# Patient Record
Sex: Female | Born: 1935 | Race: White | Hispanic: No | Marital: Married | Smoking: Never smoker
Health system: Southern US, Community
[De-identification: ages and names within clinical notes are randomized; demographics above are authoritative.]

## PROBLEM LIST (undated history)

## (undated) DIAGNOSIS — E785 Hyperlipidemia, unspecified: Secondary | ICD-10-CM

## (undated) DIAGNOSIS — I350 Nonrheumatic aortic (valve) stenosis: Secondary | ICD-10-CM

## (undated) DIAGNOSIS — I10 Essential (primary) hypertension: Secondary | ICD-10-CM

## (undated) DIAGNOSIS — I2510 Atherosclerotic heart disease of native coronary artery without angina pectoris: Secondary | ICD-10-CM

## (undated) HISTORY — DX: Hyperlipidemia, unspecified: E78.5

## (undated) HISTORY — DX: Atherosclerotic heart disease of native coronary artery without angina pectoris: I25.10

## (undated) HISTORY — DX: Nonrheumatic aortic (valve) stenosis: I35.0

## (undated) HISTORY — DX: Essential (primary) hypertension: I10

---

## 1998-08-17 ENCOUNTER — Other Ambulatory Visit: Admission: RE | Admit: 1998-08-17 | Discharge: 1998-08-17 | Payer: Self-pay | Admitting: Internal Medicine

## 1999-03-30 ENCOUNTER — Encounter: Admission: RE | Admit: 1999-03-30 | Discharge: 1999-03-30 | Payer: Self-pay | Admitting: Internal Medicine

## 1999-03-30 ENCOUNTER — Encounter: Payer: Self-pay | Admitting: Internal Medicine

## 2000-02-06 ENCOUNTER — Encounter: Payer: Self-pay | Admitting: General Surgery

## 2000-02-06 ENCOUNTER — Encounter: Admission: RE | Admit: 2000-02-06 | Discharge: 2000-02-06 | Payer: Self-pay | Admitting: General Surgery

## 2001-02-25 ENCOUNTER — Encounter: Admission: RE | Admit: 2001-02-25 | Discharge: 2001-02-25 | Payer: Self-pay | Admitting: Internal Medicine

## 2001-02-25 ENCOUNTER — Encounter: Payer: Self-pay | Admitting: Internal Medicine

## 2003-01-21 ENCOUNTER — Encounter: Payer: Self-pay | Admitting: Internal Medicine

## 2003-01-21 ENCOUNTER — Encounter: Admission: RE | Admit: 2003-01-21 | Discharge: 2003-01-21 | Payer: Self-pay | Admitting: Internal Medicine

## 2004-01-24 ENCOUNTER — Encounter: Admission: RE | Admit: 2004-01-24 | Discharge: 2004-01-24 | Payer: Self-pay | Admitting: Internal Medicine

## 2005-02-21 ENCOUNTER — Encounter: Admission: RE | Admit: 2005-02-21 | Discharge: 2005-02-21 | Payer: Self-pay | Admitting: Internal Medicine

## 2006-03-04 ENCOUNTER — Encounter: Admission: RE | Admit: 2006-03-04 | Discharge: 2006-03-04 | Payer: Self-pay | Admitting: Internal Medicine

## 2006-12-10 ENCOUNTER — Other Ambulatory Visit: Admission: RE | Admit: 2006-12-10 | Discharge: 2006-12-10 | Payer: Self-pay | Admitting: Internal Medicine

## 2007-03-10 ENCOUNTER — Encounter: Admission: RE | Admit: 2007-03-10 | Discharge: 2007-03-10 | Payer: Self-pay | Admitting: Internal Medicine

## 2008-03-10 ENCOUNTER — Encounter: Admission: RE | Admit: 2008-03-10 | Discharge: 2008-03-10 | Payer: Self-pay | Admitting: Internal Medicine

## 2009-03-15 ENCOUNTER — Encounter: Admission: RE | Admit: 2009-03-15 | Discharge: 2009-03-15 | Payer: Self-pay | Admitting: Internal Medicine

## 2010-03-20 ENCOUNTER — Encounter
Admission: RE | Admit: 2010-03-20 | Discharge: 2010-03-20 | Payer: Self-pay | Source: Home / Self Care | Attending: Internal Medicine | Admitting: Internal Medicine

## 2010-05-02 HISTORY — PX: OTHER SURGICAL HISTORY: SHX169

## 2011-02-28 ENCOUNTER — Other Ambulatory Visit: Payer: Self-pay | Admitting: Internal Medicine

## 2011-02-28 DIAGNOSIS — Z1231 Encounter for screening mammogram for malignant neoplasm of breast: Secondary | ICD-10-CM

## 2011-04-10 DIAGNOSIS — Z7901 Long term (current) use of anticoagulants: Secondary | ICD-10-CM | POA: Diagnosis not present

## 2011-04-17 ENCOUNTER — Ambulatory Visit
Admission: RE | Admit: 2011-04-17 | Discharge: 2011-04-17 | Disposition: A | Discharge status: Home / Self Care | Payer: Medicare Other | Source: Ambulatory Visit | Attending: Internal Medicine | Admitting: Internal Medicine

## 2011-04-17 DIAGNOSIS — Z1231 Encounter for screening mammogram for malignant neoplasm of breast: Secondary | ICD-10-CM | POA: Diagnosis not present

## 2011-05-08 DIAGNOSIS — Z7901 Long term (current) use of anticoagulants: Secondary | ICD-10-CM | POA: Diagnosis not present

## 2011-06-06 DIAGNOSIS — Z7901 Long term (current) use of anticoagulants: Secondary | ICD-10-CM | POA: Diagnosis not present

## 2011-07-09 DIAGNOSIS — E7800 Pure hypercholesterolemia, unspecified: Secondary | ICD-10-CM | POA: Diagnosis not present

## 2011-07-09 DIAGNOSIS — Z7901 Long term (current) use of anticoagulants: Secondary | ICD-10-CM | POA: Diagnosis not present

## 2011-07-09 DIAGNOSIS — Z79899 Other long term (current) drug therapy: Secondary | ICD-10-CM | POA: Diagnosis not present

## 2011-07-31 DIAGNOSIS — N289 Disorder of kidney and ureter, unspecified: Secondary | ICD-10-CM | POA: Diagnosis not present

## 2011-08-07 DIAGNOSIS — Z7901 Long term (current) use of anticoagulants: Secondary | ICD-10-CM | POA: Diagnosis not present

## 2011-09-11 DIAGNOSIS — Z7901 Long term (current) use of anticoagulants: Secondary | ICD-10-CM | POA: Diagnosis not present

## 2011-10-09 DIAGNOSIS — Z7901 Long term (current) use of anticoagulants: Secondary | ICD-10-CM | POA: Diagnosis not present

## 2011-10-30 DIAGNOSIS — Z7901 Long term (current) use of anticoagulants: Secondary | ICD-10-CM | POA: Diagnosis not present

## 2011-11-26 DIAGNOSIS — Z7901 Long term (current) use of anticoagulants: Secondary | ICD-10-CM | POA: Diagnosis not present

## 2011-12-31 DIAGNOSIS — Z7901 Long term (current) use of anticoagulants: Secondary | ICD-10-CM | POA: Diagnosis not present

## 2012-01-09 DIAGNOSIS — M949 Disorder of cartilage, unspecified: Secondary | ICD-10-CM | POA: Diagnosis not present

## 2012-01-09 DIAGNOSIS — M899 Disorder of bone, unspecified: Secondary | ICD-10-CM | POA: Diagnosis not present

## 2012-01-15 DIAGNOSIS — Z23 Encounter for immunization: Secondary | ICD-10-CM | POA: Diagnosis not present

## 2012-01-15 DIAGNOSIS — Z0000 Encounter for general adult medical examination without abnormal findings: Secondary | ICD-10-CM | POA: Diagnosis not present

## 2012-01-28 DIAGNOSIS — Z7901 Long term (current) use of anticoagulants: Secondary | ICD-10-CM | POA: Diagnosis not present

## 2012-01-29 DIAGNOSIS — H524 Presbyopia: Secondary | ICD-10-CM | POA: Diagnosis not present

## 2012-01-29 DIAGNOSIS — H1045 Other chronic allergic conjunctivitis: Secondary | ICD-10-CM | POA: Diagnosis not present

## 2012-01-29 DIAGNOSIS — H00029 Hordeolum internum unspecified eye, unspecified eyelid: Secondary | ICD-10-CM | POA: Diagnosis not present

## 2012-01-29 DIAGNOSIS — H04129 Dry eye syndrome of unspecified lacrimal gland: Secondary | ICD-10-CM | POA: Diagnosis not present

## 2012-02-04 DIAGNOSIS — E7800 Pure hypercholesterolemia, unspecified: Secondary | ICD-10-CM | POA: Diagnosis not present

## 2012-02-04 DIAGNOSIS — I10 Essential (primary) hypertension: Secondary | ICD-10-CM | POA: Diagnosis not present

## 2012-02-04 DIAGNOSIS — Z79899 Other long term (current) drug therapy: Secondary | ICD-10-CM | POA: Diagnosis not present

## 2012-02-11 DIAGNOSIS — E7800 Pure hypercholesterolemia, unspecified: Secondary | ICD-10-CM | POA: Diagnosis not present

## 2012-02-11 DIAGNOSIS — Z0000 Encounter for general adult medical examination without abnormal findings: Secondary | ICD-10-CM | POA: Diagnosis not present

## 2012-02-11 DIAGNOSIS — K5900 Constipation, unspecified: Secondary | ICD-10-CM | POA: Diagnosis not present

## 2012-02-11 DIAGNOSIS — Z7901 Long term (current) use of anticoagulants: Secondary | ICD-10-CM | POA: Diagnosis not present

## 2012-02-11 DIAGNOSIS — Z23 Encounter for immunization: Secondary | ICD-10-CM | POA: Diagnosis not present

## 2012-02-11 DIAGNOSIS — I10 Essential (primary) hypertension: Secondary | ICD-10-CM | POA: Diagnosis not present

## 2012-02-11 DIAGNOSIS — M159 Polyosteoarthritis, unspecified: Secondary | ICD-10-CM | POA: Diagnosis not present

## 2012-02-18 DIAGNOSIS — Z1212 Encounter for screening for malignant neoplasm of rectum: Secondary | ICD-10-CM | POA: Diagnosis not present

## 2012-02-25 DIAGNOSIS — Z7901 Long term (current) use of anticoagulants: Secondary | ICD-10-CM | POA: Diagnosis not present

## 2012-03-18 DIAGNOSIS — Z7901 Long term (current) use of anticoagulants: Secondary | ICD-10-CM | POA: Diagnosis not present

## 2012-04-28 DIAGNOSIS — Z7901 Long term (current) use of anticoagulants: Secondary | ICD-10-CM | POA: Diagnosis not present

## 2012-05-26 DIAGNOSIS — Z7901 Long term (current) use of anticoagulants: Secondary | ICD-10-CM | POA: Diagnosis not present

## 2012-06-10 DIAGNOSIS — E782 Mixed hyperlipidemia: Secondary | ICD-10-CM | POA: Diagnosis not present

## 2012-06-10 DIAGNOSIS — I359 Nonrheumatic aortic valve disorder, unspecified: Secondary | ICD-10-CM | POA: Diagnosis not present

## 2012-06-10 DIAGNOSIS — I10 Essential (primary) hypertension: Secondary | ICD-10-CM | POA: Diagnosis not present

## 2012-06-10 DIAGNOSIS — Z7901 Long term (current) use of anticoagulants: Secondary | ICD-10-CM | POA: Diagnosis not present

## 2012-06-11 ENCOUNTER — Other Ambulatory Visit (HOSPITAL_COMMUNITY): Payer: Self-pay | Admitting: Cardiovascular Disease

## 2012-06-11 DIAGNOSIS — Z952 Presence of prosthetic heart valve: Secondary | ICD-10-CM

## 2012-06-23 ENCOUNTER — Ambulatory Visit: Payer: Self-pay | Admitting: Cardiovascular Disease

## 2012-06-23 DIAGNOSIS — Z952 Presence of prosthetic heart valve: Secondary | ICD-10-CM | POA: Insufficient documentation

## 2012-06-23 DIAGNOSIS — Z7901 Long term (current) use of anticoagulants: Secondary | ICD-10-CM | POA: Insufficient documentation

## 2012-06-24 ENCOUNTER — Ambulatory Visit (HOSPITAL_COMMUNITY): Payer: Medicare Other

## 2012-07-01 ENCOUNTER — Ambulatory Visit (HOSPITAL_COMMUNITY)
Admission: RE | Admit: 2012-07-01 | Discharge: 2012-07-01 | Disposition: A | Discharge status: Home / Self Care | Payer: Medicare Other | Source: Ambulatory Visit | Attending: Cardiovascular Disease | Admitting: Cardiovascular Disease

## 2012-07-01 DIAGNOSIS — Z954 Presence of other heart-valve replacement: Secondary | ICD-10-CM | POA: Insufficient documentation

## 2012-07-01 DIAGNOSIS — I079 Rheumatic tricuspid valve disease, unspecified: Secondary | ICD-10-CM | POA: Diagnosis not present

## 2012-07-01 DIAGNOSIS — I080 Rheumatic disorders of both mitral and aortic valves: Secondary | ICD-10-CM | POA: Diagnosis not present

## 2012-07-01 DIAGNOSIS — E785 Hyperlipidemia, unspecified: Secondary | ICD-10-CM | POA: Insufficient documentation

## 2012-07-01 DIAGNOSIS — I10 Essential (primary) hypertension: Secondary | ICD-10-CM | POA: Diagnosis not present

## 2012-07-01 DIAGNOSIS — I359 Nonrheumatic aortic valve disorder, unspecified: Secondary | ICD-10-CM | POA: Diagnosis not present

## 2012-07-01 DIAGNOSIS — Z952 Presence of prosthetic heart valve: Secondary | ICD-10-CM

## 2012-07-01 HISTORY — PX: TRANSTHORACIC ECHOCARDIOGRAM: SHX275

## 2012-07-01 NOTE — Progress Notes (Signed)
[employer] [facility]   2D echo completed 07/01/2012.   Cindy Somer Trotter, RDCS  

## 2012-07-22 DIAGNOSIS — Z7901 Long term (current) use of anticoagulants: Secondary | ICD-10-CM | POA: Diagnosis not present

## 2012-08-12 DIAGNOSIS — Z7901 Long term (current) use of anticoagulants: Secondary | ICD-10-CM | POA: Diagnosis not present

## 2012-08-12 DIAGNOSIS — E7800 Pure hypercholesterolemia, unspecified: Secondary | ICD-10-CM | POA: Diagnosis not present

## 2012-08-18 DIAGNOSIS — S61209A Unspecified open wound of unspecified finger without damage to nail, initial encounter: Secondary | ICD-10-CM | POA: Diagnosis not present

## 2012-08-26 DIAGNOSIS — Z7901 Long term (current) use of anticoagulants: Secondary | ICD-10-CM | POA: Diagnosis not present

## 2012-08-27 LAB — PROTIME-INR: INR: 2.5 — AB (ref <=1.1)

## 2012-08-29 ENCOUNTER — Ambulatory Visit (INDEPENDENT_AMBULATORY_CARE_PROVIDER_SITE_OTHER): Payer: Self-pay | Admitting: Pharmacist Clinician (PhC)/ Clinical Pharmacy Specialist

## 2012-08-29 DIAGNOSIS — Z954 Presence of other heart-valve replacement: Secondary | ICD-10-CM

## 2012-08-29 DIAGNOSIS — Z7901 Long term (current) use of anticoagulants: Secondary | ICD-10-CM

## 2012-08-29 DIAGNOSIS — Z952 Presence of prosthetic heart valve: Secondary | ICD-10-CM

## 2012-09-22 DIAGNOSIS — Z7901 Long term (current) use of anticoagulants: Secondary | ICD-10-CM | POA: Diagnosis not present

## 2012-09-22 LAB — PROTIME-INR: INR: 3.4 — AB (ref <=1.1)

## 2012-09-24 ENCOUNTER — Ambulatory Visit (INDEPENDENT_AMBULATORY_CARE_PROVIDER_SITE_OTHER): Payer: Self-pay | Admitting: Pharmacist Clinician (PhC)/ Clinical Pharmacy Specialist

## 2012-09-24 DIAGNOSIS — Z954 Presence of other heart-valve replacement: Secondary | ICD-10-CM

## 2012-09-24 DIAGNOSIS — Z7901 Long term (current) use of anticoagulants: Secondary | ICD-10-CM

## 2012-09-24 DIAGNOSIS — Z952 Presence of prosthetic heart valve: Secondary | ICD-10-CM

## 2012-10-03 DIAGNOSIS — S20219A Contusion of unspecified front wall of thorax, initial encounter: Secondary | ICD-10-CM | POA: Diagnosis not present

## 2012-10-03 DIAGNOSIS — IMO0002 Reserved for concepts with insufficient information to code with codable children: Secondary | ICD-10-CM | POA: Diagnosis not present

## 2012-10-03 DIAGNOSIS — S298XXA Other specified injuries of thorax, initial encounter: Secondary | ICD-10-CM | POA: Diagnosis not present

## 2012-10-27 DIAGNOSIS — Z7901 Long term (current) use of anticoagulants: Secondary | ICD-10-CM | POA: Diagnosis not present

## 2012-10-27 LAB — PROTIME-INR

## 2012-10-30 ENCOUNTER — Encounter: Payer: Self-pay | Admitting: Cardiovascular Disease

## 2012-10-30 LAB — PROTIME-INR: INR: 2.7 — AB (ref <=1.1)

## 2012-11-03 ENCOUNTER — Ambulatory Visit (INDEPENDENT_AMBULATORY_CARE_PROVIDER_SITE_OTHER): Payer: Self-pay | Admitting: Pharmacist

## 2012-11-03 DIAGNOSIS — Z7901 Long term (current) use of anticoagulants: Secondary | ICD-10-CM

## 2012-11-03 DIAGNOSIS — Z952 Presence of prosthetic heart valve: Secondary | ICD-10-CM

## 2012-11-03 DIAGNOSIS — Z954 Presence of other heart-valve replacement: Secondary | ICD-10-CM

## 2012-11-05 DIAGNOSIS — L255 Unspecified contact dermatitis due to plants, except food: Secondary | ICD-10-CM | POA: Diagnosis not present

## 2012-11-24 DIAGNOSIS — Z7901 Long term (current) use of anticoagulants: Secondary | ICD-10-CM | POA: Diagnosis not present

## 2012-11-24 LAB — PROTIME-INR: INR: 2.5 — AB (ref <=1.1)

## 2012-11-25 ENCOUNTER — Ambulatory Visit (INDEPENDENT_AMBULATORY_CARE_PROVIDER_SITE_OTHER): Payer: Self-pay | Admitting: Pharmacist Clinician (PhC)/ Clinical Pharmacy Specialist

## 2012-11-25 DIAGNOSIS — Z7901 Long term (current) use of anticoagulants: Secondary | ICD-10-CM

## 2012-11-25 DIAGNOSIS — Z954 Presence of other heart-valve replacement: Secondary | ICD-10-CM

## 2012-11-25 DIAGNOSIS — Z952 Presence of prosthetic heart valve: Secondary | ICD-10-CM

## 2012-12-29 DIAGNOSIS — Z7901 Long term (current) use of anticoagulants: Secondary | ICD-10-CM | POA: Diagnosis not present

## 2012-12-29 LAB — PROTIME-INR: INR: 2.3 — AB (ref <=1.1)

## 2012-12-30 ENCOUNTER — Ambulatory Visit (INDEPENDENT_AMBULATORY_CARE_PROVIDER_SITE_OTHER): Payer: Medicare Other | Admitting: Pharmacist Clinician (PhC)/ Clinical Pharmacy Specialist

## 2012-12-30 DIAGNOSIS — Z954 Presence of other heart-valve replacement: Secondary | ICD-10-CM

## 2012-12-30 DIAGNOSIS — Z952 Presence of prosthetic heart valve: Secondary | ICD-10-CM

## 2012-12-30 DIAGNOSIS — Z7901 Long term (current) use of anticoagulants: Secondary | ICD-10-CM

## 2013-01-05 ENCOUNTER — Encounter: Payer: Self-pay | Admitting: Cardiovascular Disease

## 2013-01-09 ENCOUNTER — Other Ambulatory Visit: Payer: Self-pay | Admitting: Pharmacist Clinician (PhC)/ Clinical Pharmacy Specialist

## 2013-01-09 MED ORDER — WARFARIN SODIUM 2.5 MG PO TABS: 2.5000 mg | ORAL_TABLET | Freq: Every day | ORAL | Status: DC

## 2013-01-20 ENCOUNTER — Telehealth: Payer: Self-pay | Admitting: Cardiovascular Disease

## 2013-01-20 DIAGNOSIS — Z7901 Long term (current) use of anticoagulants: Secondary | ICD-10-CM

## 2013-01-20 LAB — PROTIME-INR: INR: 2.4 — AB (ref <=1.1)

## 2013-01-20 NOTE — Telephone Encounter (Signed)
Need a standing order for her PT please.Fax number is [phone]

## 2013-01-20 NOTE — Telephone Encounter (Signed)
Order placed.  Call to [facility] and informed.  Stated she isn't able to view order and pt will need new orders for next month.  Informed Belenda Cruise, pharmacist will be notified.  Verbalized understanding.

## 2013-01-21 ENCOUNTER — Ambulatory Visit (INDEPENDENT_AMBULATORY_CARE_PROVIDER_SITE_OTHER): Payer: Medicare Other | Admitting: Pharmacist Clinician (PhC)/ Clinical Pharmacy Specialist

## 2013-01-21 DIAGNOSIS — Z7901 Long term (current) use of anticoagulants: Secondary | ICD-10-CM

## 2013-01-21 DIAGNOSIS — Z952 Presence of prosthetic heart valve: Secondary | ICD-10-CM

## 2013-01-21 DIAGNOSIS — Z954 Presence of other heart-valve replacement: Secondary | ICD-10-CM

## 2013-02-03 NOTE — Telephone Encounter (Signed)
Order printed and faxed.

## 2013-02-03 NOTE — Telephone Encounter (Signed)
Still waiting for standing order.[phone]

## 2013-02-09 DIAGNOSIS — K219 Gastro-esophageal reflux disease without esophagitis: Secondary | ICD-10-CM | POA: Diagnosis not present

## 2013-02-09 DIAGNOSIS — E7800 Pure hypercholesterolemia, unspecified: Secondary | ICD-10-CM | POA: Diagnosis not present

## 2013-02-09 DIAGNOSIS — Z79899 Other long term (current) drug therapy: Secondary | ICD-10-CM | POA: Diagnosis not present

## 2013-02-09 DIAGNOSIS — I10 Essential (primary) hypertension: Secondary | ICD-10-CM | POA: Diagnosis not present

## 2013-02-09 DIAGNOSIS — M810 Age-related osteoporosis without current pathological fracture: Secondary | ICD-10-CM | POA: Diagnosis not present

## 2013-02-18 DIAGNOSIS — N8111 Cystocele, midline: Secondary | ICD-10-CM | POA: Diagnosis not present

## 2013-02-18 DIAGNOSIS — Z23 Encounter for immunization: Secondary | ICD-10-CM | POA: Diagnosis not present

## 2013-02-18 DIAGNOSIS — K219 Gastro-esophageal reflux disease without esophagitis: Secondary | ICD-10-CM | POA: Diagnosis not present

## 2013-02-18 DIAGNOSIS — M159 Polyosteoarthritis, unspecified: Secondary | ICD-10-CM | POA: Diagnosis not present

## 2013-02-18 DIAGNOSIS — Z954 Presence of other heart-valve replacement: Secondary | ICD-10-CM | POA: Diagnosis not present

## 2013-02-23 DIAGNOSIS — Z7901 Long term (current) use of anticoagulants: Secondary | ICD-10-CM | POA: Diagnosis not present

## 2013-02-23 LAB — PROTIME-INR: INR: 3 — AB (ref <=1.1)

## 2013-02-24 ENCOUNTER — Ambulatory Visit (INDEPENDENT_AMBULATORY_CARE_PROVIDER_SITE_OTHER): Payer: Medicare Other | Admitting: Pharmacist Clinician (PhC)/ Clinical Pharmacy Specialist

## 2013-02-24 DIAGNOSIS — Z7901 Long term (current) use of anticoagulants: Secondary | ICD-10-CM

## 2013-02-24 DIAGNOSIS — Z952 Presence of prosthetic heart valve: Secondary | ICD-10-CM

## 2013-02-24 DIAGNOSIS — Z954 Presence of other heart-valve replacement: Secondary | ICD-10-CM

## 2013-03-30 DIAGNOSIS — Z7901 Long term (current) use of anticoagulants: Secondary | ICD-10-CM | POA: Diagnosis not present

## 2013-03-30 LAB — PROTIME-INR: INR: 2.5 — AB (ref <=1.1)

## 2013-03-31 ENCOUNTER — Ambulatory Visit (INDEPENDENT_AMBULATORY_CARE_PROVIDER_SITE_OTHER): Payer: Medicare Other | Admitting: Pharmacist Clinician (PhC)/ Clinical Pharmacy Specialist

## 2013-03-31 DIAGNOSIS — Z7901 Long term (current) use of anticoagulants: Secondary | ICD-10-CM

## 2013-03-31 DIAGNOSIS — Z954 Presence of other heart-valve replacement: Secondary | ICD-10-CM

## 2013-03-31 DIAGNOSIS — Z952 Presence of prosthetic heart valve: Secondary | ICD-10-CM

## 2013-04-07 DIAGNOSIS — N390 Urinary tract infection, site not specified: Secondary | ICD-10-CM | POA: Diagnosis not present

## 2013-04-07 DIAGNOSIS — M545 Low back pain, unspecified: Secondary | ICD-10-CM | POA: Diagnosis not present

## 2013-04-07 DIAGNOSIS — R918 Other nonspecific abnormal finding of lung field: Secondary | ICD-10-CM | POA: Diagnosis not present

## 2013-04-07 DIAGNOSIS — M4310 Spondylolisthesis, site unspecified: Secondary | ICD-10-CM | POA: Diagnosis not present

## 2013-04-07 DIAGNOSIS — Z954 Presence of other heart-valve replacement: Secondary | ICD-10-CM | POA: Diagnosis not present

## 2013-04-07 DIAGNOSIS — M47817 Spondylosis without myelopathy or radiculopathy, lumbosacral region: Secondary | ICD-10-CM | POA: Diagnosis not present

## 2013-04-13 DIAGNOSIS — C44319 Basal cell carcinoma of skin of other parts of face: Secondary | ICD-10-CM | POA: Diagnosis not present

## 2013-04-28 ENCOUNTER — Telehealth: Payer: Self-pay | Admitting: Cardiovascular Disease

## 2013-04-28 DIAGNOSIS — Z952 Presence of prosthetic heart valve: Secondary | ICD-10-CM

## 2013-04-28 DIAGNOSIS — Z7901 Long term (current) use of anticoagulants: Secondary | ICD-10-CM

## 2013-04-28 LAB — PROTIME-INR

## 2013-04-28 LAB — POCT INR: INR: 3

## 2013-04-28 NOTE — Telephone Encounter (Signed)
Standing order faxed to [facility]

## 2013-04-28 NOTE — Telephone Encounter (Signed)
Need a start and stop date for PT/INR for standing order and it cannot be more than 6 mos.  Stated it also needs to include how often to check.  Fax: [phone]  Message forwarded to K. Alvstad, PharmD.

## 2013-05-01 ENCOUNTER — Ambulatory Visit (INDEPENDENT_AMBULATORY_CARE_PROVIDER_SITE_OTHER): Payer: Medicare Other | Admitting: Pharmacist Clinician (PhC)/ Clinical Pharmacy Specialist

## 2013-05-01 ENCOUNTER — Telehealth: Payer: Self-pay | Admitting: Cardiovascular Disease

## 2013-05-01 DIAGNOSIS — Z7901 Long term (current) use of anticoagulants: Secondary | ICD-10-CM

## 2013-05-01 DIAGNOSIS — Z952 Presence of prosthetic heart valve: Secondary | ICD-10-CM

## 2013-05-01 DIAGNOSIS — Z954 Presence of other heart-valve replacement: Secondary | ICD-10-CM

## 2013-05-01 NOTE — Telephone Encounter (Signed)
Would like to speak to you about her coumadin . Please Call   Thanks

## 2013-05-01 NOTE — Telephone Encounter (Signed)
See anticoag note

## 2013-06-01 DIAGNOSIS — Z7901 Long term (current) use of anticoagulants: Secondary | ICD-10-CM | POA: Diagnosis not present

## 2013-06-01 LAB — PROTIME-INR: INR: 2.7 — AB (ref <=1.1)

## 2013-06-02 ENCOUNTER — Ambulatory Visit (INDEPENDENT_AMBULATORY_CARE_PROVIDER_SITE_OTHER): Payer: Medicare Other | Admitting: Pharmacist Clinician (PhC)/ Clinical Pharmacy Specialist

## 2013-06-02 DIAGNOSIS — Z952 Presence of prosthetic heart valve: Secondary | ICD-10-CM

## 2013-06-02 DIAGNOSIS — Z7901 Long term (current) use of anticoagulants: Secondary | ICD-10-CM

## 2013-06-02 DIAGNOSIS — Z954 Presence of other heart-valve replacement: Secondary | ICD-10-CM

## 2013-06-08 ENCOUNTER — Ambulatory Visit (INDEPENDENT_AMBULATORY_CARE_PROVIDER_SITE_OTHER): Payer: Medicare Other | Admitting: Cardiovascular Disease

## 2013-06-08 ENCOUNTER — Encounter: Payer: Self-pay | Admitting: Cardiovascular Disease

## 2013-06-08 VITALS — BP 130/58 | HR 69 | Ht 63.0 in | Wt 129.3 lb

## 2013-06-08 DIAGNOSIS — E785 Hyperlipidemia, unspecified: Secondary | ICD-10-CM

## 2013-06-08 DIAGNOSIS — Z954 Presence of other heart-valve replacement: Secondary | ICD-10-CM

## 2013-06-08 DIAGNOSIS — Z952 Presence of prosthetic heart valve: Secondary | ICD-10-CM

## 2013-06-08 DIAGNOSIS — I10 Essential (primary) hypertension: Secondary | ICD-10-CM | POA: Insufficient documentation

## 2013-06-08 NOTE — Assessment & Plan Note (Signed)
On statin therapy followed by her PCP 

## 2013-06-08 NOTE — Progress Notes (Signed)
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 I last saw her November 29, 2010. She has known valvular heart disease status post aortic valve replacement with a St. Jude mechanical valve by Dr. Lilly Cove October 1998 for what sounds like bicuspid aortic stenosis. She is on chronic Coumadin anticoagulation. Her other problems include hypertension and hyperlipidemia. She is aware of SBE prophylaxis. She denies chest pain or shortness of breath. Her last echo performed December 12, 2010, showed a well-functioning aortic mechanical prosthesis.Dr. Shelia Media follows her lipid profile closely since I saw her one year ago she's remained clinically stable and specifically denies chest pain, shortness of breath or syncope.    Current Outpatient Prescriptions  Medication Sig Dispense Refill  . amLODipine (NORVASC) 2.5 MG tablet Take 2.5 mg by mouth daily.      . Ascorbic Acid (VITAMIN C) 100 MG tablet Take 100 mg by mouth daily.      [name] atorvastatin (LIPITOR) 10 MG tablet Take 10 mg by mouth every other day.      . B Complex Vitamins (B COMPLEX 100 PO) Take 100 mg by mouth daily.      . hydrochlorothiazide (HYDRODIURIL) 25 MG tablet Take 25 mg by mouth daily.      . metoprolol (LOPRESSOR) 50 MG tablet Take 25 mg by mouth daily.      [name] warfarin (COUMADIN) 2.5 MG tablet Take 1 tablet (2.5 mg total) by mouth daily.  90 tablet  1   No current facility-administered medications for this visit.    Not on File  History   Social History  . Marital Status: Married    Spouse Name: N/A    Number of Children: N/A  . Years of Education: N/A   Occupational History  . Not on file.   Social History Main Topics  .  Smoking status: Never Smoker   . Smokeless tobacco: Not on file  . Alcohol Use: Not on file  . Drug Use: Not on file  . Sexual Activity: Not on file   Other Topics Concern  . Not on file   Social History Narrative  . No narrative on file     Review of Systems: General: negative for chills, fever, night sweats or weight changes.  Cardiovascular: negative for chest pain, dyspnea on exertion, edema, orthopnea, palpitations, paroxysmal nocturnal dyspnea or shortness of breath Dermatological: negative for rash Respiratory: negative for cough or wheezing Urologic: negative for hematuria Abdominal: negative for nausea, vomiting, diarrhea, bright red blood per rectum, melena, or hematemesis Neurologic: negative for visual changes, syncope, or dizziness All other systems reviewed and are otherwise negative except as noted above.    Blood pressure 130/58, pulse 69, height 5\' 3"  (1.6 m), weight 58.65 kg (129 lb 4.8 oz).  General appearance: alert and no distress Neck: no adenopathy, no carotid bruit, no JVD, supple, symmetrical, trachea midline and thyroid not enlarged, symmetric, no tenderness/mass/nodules Lungs: clear to auscultation bilaterally Heart: crisp prosthetic valve sounds Extremities: extremities normal, atraumatic, no cyanosis or edema  EKG normal sinus rhythm at 69 with evidence of LVH with strain.  ASSESSMENT AND PLAN:   H/O aortic valve replacement Status post St. Jude aVR by Dr. Roxy Manns October 1998 for what  sounds like bicuspid aortic valve stenosis. She is on chronic Coumadin anticoagulation. She is aware of SBE prophylaxis. We'll obtain 2-D echoes on her annulate to follow her valve.  Hyperlipidemia On statin therapy followed by her PCP  Essential hypertension Well-controlled on current medications      Lorretta Harp MD [facility], [facility] 06/08/2013 4:15 PM

## 2013-06-08 NOTE — Assessment & Plan Note (Signed)
Well-controlled on current medications 

## 2013-06-08 NOTE — Patient Instructions (Addendum)
Your physician has requested that you have an echocardiogram in May. Echocardiography is a painless test that uses sound waves to create images of your heart. It provides your doctor with information about the size and shape of your heart and how well your heart's chambers and valves are working. This procedure takes approximately one hour. There are no restrictions for this procedure.  Dr Gwenlyn Found wants you to follow-up in 1 year. You will receive a reminder letter in the mail two months in advance. If you don't receive a letter, please call our office to schedule the follow-up appointment.

## 2013-06-08 NOTE — Assessment & Plan Note (Signed)
Status post St. Jude aVR by Dr. Roxy Manns October 1998 for what sounds like bicuspid aortic valve stenosis. She is on chronic Coumadin anticoagulation. She is aware of SBE prophylaxis. We'll obtain 2-D echoes on her annulate to follow her valve.

## 2013-06-09 ENCOUNTER — Ambulatory Visit: Payer: Medicare Other | Admitting: Cardiovascular Disease

## 2013-07-07 DIAGNOSIS — Z96619 Presence of unspecified artificial shoulder joint: Secondary | ICD-10-CM | POA: Diagnosis not present

## 2013-07-07 DIAGNOSIS — Z954 Presence of other heart-valve replacement: Secondary | ICD-10-CM | POA: Diagnosis not present

## 2013-07-07 LAB — POCT INR: INR: 2

## 2013-07-08 ENCOUNTER — Ambulatory Visit (INDEPENDENT_AMBULATORY_CARE_PROVIDER_SITE_OTHER): Payer: Medicare Other | Admitting: Pharmacist Clinician (PhC)/ Clinical Pharmacy Specialist

## 2013-07-08 DIAGNOSIS — Z7901 Long term (current) use of anticoagulants: Secondary | ICD-10-CM

## 2013-07-08 DIAGNOSIS — Z952 Presence of prosthetic heart valve: Secondary | ICD-10-CM

## 2013-07-08 DIAGNOSIS — Z954 Presence of other heart-valve replacement: Secondary | ICD-10-CM

## 2013-07-17 ENCOUNTER — Other Ambulatory Visit: Payer: Self-pay | Admitting: Pharmacist Clinician (PhC)/ Clinical Pharmacy Specialist

## 2013-07-17 MED ORDER — WARFARIN SODIUM 2.5 MG PO TABS: ORAL_TABLET | ORAL | Status: DC

## 2013-07-21 DIAGNOSIS — Z954 Presence of other heart-valve replacement: Secondary | ICD-10-CM | POA: Diagnosis not present

## 2013-07-21 DIAGNOSIS — Z7901 Long term (current) use of anticoagulants: Secondary | ICD-10-CM | POA: Diagnosis not present

## 2013-07-21 LAB — PROTIME-INR: INR: 2.8 — AB (ref 0.9–1.1)

## 2013-07-23 ENCOUNTER — Ambulatory Visit (INDEPENDENT_AMBULATORY_CARE_PROVIDER_SITE_OTHER): Payer: Medicare Other | Admitting: Pharmacist Clinician (PhC)/ Clinical Pharmacy Specialist

## 2013-07-23 DIAGNOSIS — Z954 Presence of other heart-valve replacement: Secondary | ICD-10-CM

## 2013-07-23 DIAGNOSIS — Z7901 Long term (current) use of anticoagulants: Secondary | ICD-10-CM

## 2013-07-23 DIAGNOSIS — Z952 Presence of prosthetic heart valve: Secondary | ICD-10-CM

## 2013-07-28 DIAGNOSIS — M545 Low back pain, unspecified: Secondary | ICD-10-CM | POA: Diagnosis not present

## 2013-07-28 DIAGNOSIS — J309 Allergic rhinitis, unspecified: Secondary | ICD-10-CM | POA: Diagnosis not present

## 2013-07-28 DIAGNOSIS — E7800 Pure hypercholesterolemia, unspecified: Secondary | ICD-10-CM | POA: Diagnosis not present

## 2013-07-28 DIAGNOSIS — I10 Essential (primary) hypertension: Secondary | ICD-10-CM | POA: Diagnosis not present

## 2013-08-04 ENCOUNTER — Ambulatory Visit (HOSPITAL_COMMUNITY)
Admission: RE | Admit: 2013-08-04 | Discharge: 2013-08-04 | Disposition: A | Discharge status: Home / Self Care | Payer: Medicare Other | Source: Ambulatory Visit | Attending: Cardiovascular Disease | Admitting: Cardiovascular Disease

## 2013-08-04 DIAGNOSIS — Z954 Presence of other heart-valve replacement: Secondary | ICD-10-CM | POA: Diagnosis not present

## 2013-08-04 DIAGNOSIS — I359 Nonrheumatic aortic valve disorder, unspecified: Secondary | ICD-10-CM | POA: Diagnosis not present

## 2013-08-04 DIAGNOSIS — Z952 Presence of prosthetic heart valve: Secondary | ICD-10-CM

## 2013-08-04 NOTE — Progress Notes (Signed)
2D Echo Performed 08/04/2013    Tjay Velazquez, RCS  

## 2013-08-25 DIAGNOSIS — Z7901 Long term (current) use of anticoagulants: Secondary | ICD-10-CM | POA: Diagnosis not present

## 2013-08-25 DIAGNOSIS — Z954 Presence of other heart-valve replacement: Secondary | ICD-10-CM | POA: Diagnosis not present

## 2013-08-25 LAB — PROTIME-INR: INR: 3.1 — AB (ref 0.9–1.1)

## 2013-08-26 ENCOUNTER — Ambulatory Visit (INDEPENDENT_AMBULATORY_CARE_PROVIDER_SITE_OTHER): Payer: Medicare Other | Admitting: Pharmacist Clinician (PhC)/ Clinical Pharmacy Specialist

## 2013-08-26 DIAGNOSIS — Z952 Presence of prosthetic heart valve: Secondary | ICD-10-CM

## 2013-08-26 DIAGNOSIS — Z954 Presence of other heart-valve replacement: Secondary | ICD-10-CM

## 2013-08-26 DIAGNOSIS — Z7901 Long term (current) use of anticoagulants: Secondary | ICD-10-CM

## 2013-09-18 DIAGNOSIS — H04129 Dry eye syndrome of unspecified lacrimal gland: Secondary | ICD-10-CM | POA: Diagnosis not present

## 2013-09-23 DIAGNOSIS — Z954 Presence of other heart-valve replacement: Secondary | ICD-10-CM | POA: Diagnosis not present

## 2013-09-23 LAB — POCT INR: INR: 3.6

## 2013-09-25 ENCOUNTER — Ambulatory Visit (INDEPENDENT_AMBULATORY_CARE_PROVIDER_SITE_OTHER): Payer: Medicare Other | Admitting: Pharmacist Clinician (PhC)/ Clinical Pharmacy Specialist

## 2013-09-25 DIAGNOSIS — Z7901 Long term (current) use of anticoagulants: Secondary | ICD-10-CM

## 2013-09-25 DIAGNOSIS — Z952 Presence of prosthetic heart valve: Secondary | ICD-10-CM

## 2013-09-25 DIAGNOSIS — Z954 Presence of other heart-valve replacement: Secondary | ICD-10-CM

## 2013-10-10 DIAGNOSIS — J0100 Acute maxillary sinusitis, unspecified: Secondary | ICD-10-CM | POA: Diagnosis not present

## 2013-10-10 DIAGNOSIS — J209 Acute bronchitis, unspecified: Secondary | ICD-10-CM | POA: Diagnosis not present

## 2013-10-12 ENCOUNTER — Other Ambulatory Visit: Payer: Self-pay

## 2013-10-12 DIAGNOSIS — Z1231 Encounter for screening mammogram for malignant neoplasm of breast: Secondary | ICD-10-CM

## 2013-10-13 ENCOUNTER — Telehealth: Payer: Self-pay | Admitting: Cardiovascular Disease

## 2013-10-13 NOTE — Telephone Encounter (Signed)
Spoke with pt.  Okay to take Augmentin.  Has follow up appt in 10 days.

## 2013-10-13 NOTE — Telephone Encounter (Signed)
called inform patient will defer to Pharm-D--Sally. Pt verbalized understanding.

## 2013-10-13 NOTE — Telephone Encounter (Signed)
Pt is on coumadin,They put her on Amoxikclav 875 mg for a touch of pneumonia. She wants to know if this would interfere with her coumadin?

## 2013-10-20 ENCOUNTER — Ambulatory Visit: Payer: Medicare Other

## 2013-10-20 DIAGNOSIS — R0989 Other specified symptoms and signs involving the circulatory and respiratory systems: Secondary | ICD-10-CM | POA: Diagnosis not present

## 2013-10-20 DIAGNOSIS — J189 Pneumonia, unspecified organism: Secondary | ICD-10-CM | POA: Diagnosis not present

## 2013-10-20 DIAGNOSIS — R0609 Other forms of dyspnea: Secondary | ICD-10-CM | POA: Diagnosis not present

## 2013-10-26 ENCOUNTER — Telehealth: Payer: Self-pay | Admitting: Cardiovascular Disease

## 2013-10-26 ENCOUNTER — Other Ambulatory Visit: Payer: Self-pay | Admitting: *Deleted

## 2013-10-26 DIAGNOSIS — Z7901 Long term (current) use of anticoagulants: Secondary | ICD-10-CM

## 2013-10-26 DIAGNOSIS — Z954 Presence of other heart-valve replacement: Secondary | ICD-10-CM | POA: Diagnosis not present

## 2013-10-26 DIAGNOSIS — Z952 Presence of prosthetic heart valve: Secondary | ICD-10-CM

## 2013-10-26 LAB — PROTIME-INR: INR: 2.5 — AB (ref 0.9–1.1)

## 2013-10-26 NOTE — Telephone Encounter (Signed)
Order placed into the epic system. Also printed and faxed to the requested number att: Lattie Haw

## 2013-10-26 NOTE — Telephone Encounter (Signed)
Please fax a standard order for her PT for six months. Please fax to [phone].GBM:[id number]

## 2013-10-27 ENCOUNTER — Telehealth: Payer: Self-pay | Admitting: Pharmacist Clinician (PhC)/ Clinical Pharmacy Specialist

## 2013-10-27 ENCOUNTER — Ambulatory Visit (INDEPENDENT_AMBULATORY_CARE_PROVIDER_SITE_OTHER): Payer: Medicare Other | Admitting: Pharmacist Clinician (PhC)/ Clinical Pharmacy Specialist

## 2013-10-27 DIAGNOSIS — Z954 Presence of other heart-valve replacement: Secondary | ICD-10-CM

## 2013-10-27 DIAGNOSIS — Z952 Presence of prosthetic heart valve: Secondary | ICD-10-CM

## 2013-10-27 DIAGNOSIS — Z7901 Long term (current) use of anticoagulants: Secondary | ICD-10-CM

## 2013-10-27 NOTE — Telephone Encounter (Signed)
Returning Julie Mcmahon's call

## 2013-10-28 NOTE — Telephone Encounter (Signed)
Nurse talked to [place].

## 2013-11-11 DIAGNOSIS — R918 Other nonspecific abnormal finding of lung field: Secondary | ICD-10-CM | POA: Diagnosis not present

## 2013-11-11 DIAGNOSIS — J189 Pneumonia, unspecified organism: Secondary | ICD-10-CM | POA: Diagnosis not present

## 2013-11-19 ENCOUNTER — Other Ambulatory Visit: Payer: Self-pay | Admitting: Internal Medicine

## 2013-11-19 DIAGNOSIS — R9389 Abnormal findings on diagnostic imaging of other specified body structures: Secondary | ICD-10-CM

## 2013-11-23 ENCOUNTER — Telehealth: Payer: Self-pay | Admitting: Pharmacist Clinician (PhC)/ Clinical Pharmacy Specialist

## 2013-11-23 NOTE — Telephone Encounter (Signed)
Pt LMOM to call back - has question.  Returned call, pt states needs CT scan, was afraid that could not be done with coumadin.  Assured her should be fine, but she needs to double check with imaging office.  Pt voiced understanding

## 2013-11-25 ENCOUNTER — Ambulatory Visit
Admission: RE | Admit: 2013-11-25 | Discharge: 2013-11-25 | Disposition: A | Discharge status: Home / Self Care | Payer: Medicare Other | Source: Ambulatory Visit | Attending: Internal Medicine | Admitting: Internal Medicine

## 2013-11-25 DIAGNOSIS — R9389 Abnormal findings on diagnostic imaging of other specified body structures: Secondary | ICD-10-CM

## 2013-11-25 DIAGNOSIS — I700 Atherosclerosis of aorta: Secondary | ICD-10-CM | POA: Diagnosis not present

## 2013-11-25 MED ORDER — IOHEXOL 300 MG/ML  SOLN
75.0000 mL | Freq: Once | INTRAMUSCULAR | Status: AC | PRN
  Administered 2013-11-25: 75 mL via INTRAVENOUS

## 2013-11-30 DIAGNOSIS — Z7901 Long term (current) use of anticoagulants: Secondary | ICD-10-CM | POA: Diagnosis not present

## 2013-12-01 ENCOUNTER — Ambulatory Visit (INDEPENDENT_AMBULATORY_CARE_PROVIDER_SITE_OTHER): Payer: Medicare Other | Admitting: Pharmacist Clinician (PhC)/ Clinical Pharmacy Specialist

## 2013-12-01 DIAGNOSIS — Z954 Presence of other heart-valve replacement: Secondary | ICD-10-CM

## 2013-12-01 DIAGNOSIS — Z7901 Long term (current) use of anticoagulants: Secondary | ICD-10-CM

## 2013-12-01 DIAGNOSIS — Z952 Presence of prosthetic heart valve: Secondary | ICD-10-CM

## 2013-12-01 LAB — PROTIME-INR
INR: 2.4 — ABNORMAL HIGH (ref 0.8–1.2)
Prothrombin Time: 25.2 s — ABNORMAL HIGH (ref 9.1–12.0)

## 2013-12-17 DIAGNOSIS — Z7901 Long term (current) use of anticoagulants: Secondary | ICD-10-CM | POA: Diagnosis not present

## 2013-12-17 LAB — PROTIME-INR: INR: 2.7 — AB (ref 0.9–1.1)

## 2013-12-18 ENCOUNTER — Ambulatory Visit (INDEPENDENT_AMBULATORY_CARE_PROVIDER_SITE_OTHER): Payer: Medicare Other | Admitting: Pharmacist Clinician (PhC)/ Clinical Pharmacy Specialist

## 2013-12-18 DIAGNOSIS — Z954 Presence of other heart-valve replacement: Secondary | ICD-10-CM

## 2013-12-18 DIAGNOSIS — Z952 Presence of prosthetic heart valve: Secondary | ICD-10-CM

## 2013-12-18 DIAGNOSIS — Z7901 Long term (current) use of anticoagulants: Secondary | ICD-10-CM

## 2013-12-21 ENCOUNTER — Other Ambulatory Visit: Payer: Self-pay | Admitting: Pharmacist Clinician (PhC)/ Clinical Pharmacy Specialist

## 2013-12-21 MED ORDER — WARFARIN SODIUM 2.5 MG PO TABS: ORAL_TABLET | ORAL | Status: DC

## 2013-12-31 ENCOUNTER — Telehealth: Payer: Self-pay | Admitting: Cardiovascular Disease

## 2013-12-31 NOTE — Telephone Encounter (Signed)
Lattie Haw called in stating that she needs the pt's new diagnosis code. Please call  Thanks

## 2013-12-31 NOTE — Telephone Encounter (Signed)
Patient coming in for an INR today and [facility] needs an up to date ICD10 code.  Told to use Z79.01.

## 2014-01-26 DIAGNOSIS — Z7901 Long term (current) use of anticoagulants: Secondary | ICD-10-CM | POA: Diagnosis not present

## 2014-01-26 LAB — POCT INR: INR: 2.4

## 2014-01-27 ENCOUNTER — Ambulatory Visit (INDEPENDENT_AMBULATORY_CARE_PROVIDER_SITE_OTHER): Payer: Medicare Other | Admitting: Pharmacist Clinician (PhC)/ Clinical Pharmacy Specialist

## 2014-01-27 DIAGNOSIS — E780 Pure hypercholesterolemia: Secondary | ICD-10-CM | POA: Diagnosis not present

## 2014-01-27 DIAGNOSIS — Z7901 Long term (current) use of anticoagulants: Secondary | ICD-10-CM | POA: Diagnosis not present

## 2014-01-27 DIAGNOSIS — Z952 Presence of prosthetic heart valve: Secondary | ICD-10-CM | POA: Diagnosis not present

## 2014-01-27 DIAGNOSIS — R32 Unspecified urinary incontinence: Secondary | ICD-10-CM | POA: Diagnosis not present

## 2014-01-27 DIAGNOSIS — M1990 Unspecified osteoarthritis, unspecified site: Secondary | ICD-10-CM | POA: Diagnosis not present

## 2014-01-27 DIAGNOSIS — H66001 Acute suppurative otitis media without spontaneous rupture of ear drum, right ear: Secondary | ICD-10-CM | POA: Diagnosis not present

## 2014-01-27 DIAGNOSIS — I10 Essential (primary) hypertension: Secondary | ICD-10-CM | POA: Diagnosis not present

## 2014-01-27 DIAGNOSIS — Z954 Presence of other heart-valve replacement: Secondary | ICD-10-CM

## 2014-02-02 DIAGNOSIS — Z23 Encounter for immunization: Secondary | ICD-10-CM | POA: Diagnosis not present

## 2014-02-02 DIAGNOSIS — E780 Pure hypercholesterolemia: Secondary | ICD-10-CM | POA: Diagnosis not present

## 2014-02-02 DIAGNOSIS — I10 Essential (primary) hypertension: Secondary | ICD-10-CM | POA: Diagnosis not present

## 2014-02-02 DIAGNOSIS — I119 Hypertensive heart disease without heart failure: Secondary | ICD-10-CM | POA: Diagnosis not present

## 2014-02-02 DIAGNOSIS — R7301 Impaired fasting glucose: Secondary | ICD-10-CM | POA: Diagnosis not present

## 2014-02-17 DIAGNOSIS — Z7901 Long term (current) use of anticoagulants: Secondary | ICD-10-CM | POA: Diagnosis not present

## 2014-02-17 LAB — PROTIME-INR: INR: 2.7 — AB (ref 0.9–1.1)

## 2014-02-19 ENCOUNTER — Ambulatory Visit (INDEPENDENT_AMBULATORY_CARE_PROVIDER_SITE_OTHER): Payer: Medicare Other | Admitting: Pharmacist Clinician (PhC)/ Clinical Pharmacy Specialist

## 2014-02-19 DIAGNOSIS — Z952 Presence of prosthetic heart valve: Secondary | ICD-10-CM

## 2014-02-19 DIAGNOSIS — Z954 Presence of other heart-valve replacement: Secondary | ICD-10-CM

## 2014-03-29 ENCOUNTER — Ambulatory Visit (INDEPENDENT_AMBULATORY_CARE_PROVIDER_SITE_OTHER): Payer: Medicare Other | Admitting: Pharmacist Clinician (PhC)/ Clinical Pharmacy Specialist

## 2014-03-29 DIAGNOSIS — Z952 Presence of prosthetic heart valve: Secondary | ICD-10-CM

## 2014-03-29 DIAGNOSIS — Z954 Presence of other heart-valve replacement: Secondary | ICD-10-CM

## 2014-03-29 DIAGNOSIS — Z7901 Long term (current) use of anticoagulants: Secondary | ICD-10-CM | POA: Diagnosis not present

## 2014-03-29 LAB — PROTIME-INR
INR: 2.2 — ABNORMAL HIGH (ref 0.8–1.2)
Prothrombin Time: 22.9 s — ABNORMAL HIGH (ref 9.1–12.0)

## 2014-04-12 DIAGNOSIS — Z7901 Long term (current) use of anticoagulants: Secondary | ICD-10-CM | POA: Diagnosis not present

## 2014-04-12 LAB — PROTIME-INR: INR: 2.9 — AB (ref 0.9–1.1)

## 2014-04-14 ENCOUNTER — Ambulatory Visit (INDEPENDENT_AMBULATORY_CARE_PROVIDER_SITE_OTHER): Payer: Medicare Other | Admitting: Pharmacist Clinician (PhC)/ Clinical Pharmacy Specialist

## 2014-04-14 DIAGNOSIS — Z952 Presence of prosthetic heart valve: Secondary | ICD-10-CM

## 2014-04-14 DIAGNOSIS — Z954 Presence of other heart-valve replacement: Secondary | ICD-10-CM

## 2014-05-20 DIAGNOSIS — Z0000 Encounter for general adult medical examination without abnormal findings: Secondary | ICD-10-CM | POA: Diagnosis not present

## 2014-05-20 DIAGNOSIS — Z23 Encounter for immunization: Secondary | ICD-10-CM | POA: Diagnosis not present

## 2014-05-20 DIAGNOSIS — E780 Pure hypercholesterolemia: Secondary | ICD-10-CM | POA: Diagnosis not present

## 2014-05-20 DIAGNOSIS — E559 Vitamin D deficiency, unspecified: Secondary | ICD-10-CM | POA: Diagnosis not present

## 2014-05-20 DIAGNOSIS — I10 Essential (primary) hypertension: Secondary | ICD-10-CM | POA: Diagnosis not present

## 2014-05-25 DIAGNOSIS — Z7901 Long term (current) use of anticoagulants: Secondary | ICD-10-CM | POA: Diagnosis not present

## 2014-05-25 LAB — PROTIME-INR
INR: 2.9 — AB (ref 0.9–1.1)
INR: 2.9 — AB (ref <=1.1)

## 2014-05-26 ENCOUNTER — Ambulatory Visit (INDEPENDENT_AMBULATORY_CARE_PROVIDER_SITE_OTHER): Payer: Medicare Other | Admitting: Pharmacist Clinician (PhC)/ Clinical Pharmacy Specialist

## 2014-05-26 DIAGNOSIS — Z952 Presence of prosthetic heart valve: Secondary | ICD-10-CM

## 2014-05-26 DIAGNOSIS — Z954 Presence of other heart-valve replacement: Secondary | ICD-10-CM

## 2014-06-16 DIAGNOSIS — K5900 Constipation, unspecified: Secondary | ICD-10-CM | POA: Diagnosis not present

## 2014-06-16 DIAGNOSIS — E780 Pure hypercholesterolemia: Secondary | ICD-10-CM | POA: Diagnosis not present

## 2014-06-16 DIAGNOSIS — M159 Polyosteoarthritis, unspecified: Secondary | ICD-10-CM | POA: Diagnosis not present

## 2014-06-16 DIAGNOSIS — I10 Essential (primary) hypertension: Secondary | ICD-10-CM | POA: Diagnosis not present

## 2014-06-29 DIAGNOSIS — Z7901 Long term (current) use of anticoagulants: Secondary | ICD-10-CM | POA: Diagnosis not present

## 2014-06-29 LAB — PROTIME-INR
INR: 2.1 — AB (ref 0.9–1.1)
INR: 2.1 — AB (ref <=1.1)

## 2014-07-02 ENCOUNTER — Ambulatory Visit (INDEPENDENT_AMBULATORY_CARE_PROVIDER_SITE_OTHER): Payer: Medicare Other | Admitting: Pharmacist Clinician (PhC)/ Clinical Pharmacy Specialist

## 2014-07-02 DIAGNOSIS — Z954 Presence of other heart-valve replacement: Secondary | ICD-10-CM

## 2014-07-02 DIAGNOSIS — Z952 Presence of prosthetic heart valve: Secondary | ICD-10-CM

## 2014-07-20 ENCOUNTER — Other Ambulatory Visit: Payer: Self-pay | Admitting: Pharmacist Clinician (PhC)/ Clinical Pharmacy Specialist

## 2014-07-20 DIAGNOSIS — Z952 Presence of prosthetic heart valve: Secondary | ICD-10-CM | POA: Diagnosis not present

## 2014-07-20 DIAGNOSIS — I10 Essential (primary) hypertension: Secondary | ICD-10-CM | POA: Diagnosis not present

## 2014-07-20 MED ORDER — WARFARIN SODIUM 2.5 MG PO TABS: ORAL_TABLET | ORAL | Status: DC

## 2014-07-28 DIAGNOSIS — Z7901 Long term (current) use of anticoagulants: Secondary | ICD-10-CM | POA: Diagnosis not present

## 2014-07-28 LAB — PROTIME-INR: INR: 2.8 — AB (ref 0.9–1.1)

## 2014-07-29 ENCOUNTER — Ambulatory Visit (INDEPENDENT_AMBULATORY_CARE_PROVIDER_SITE_OTHER): Payer: Medicare Other | Admitting: Pharmacist Clinician (PhC)/ Clinical Pharmacy Specialist

## 2014-07-29 DIAGNOSIS — Z952 Presence of prosthetic heart valve: Secondary | ICD-10-CM

## 2014-07-29 DIAGNOSIS — Z954 Presence of other heart-valve replacement: Secondary | ICD-10-CM

## 2014-08-10 ENCOUNTER — Telehealth: Payer: Self-pay | Admitting: Cardiovascular Disease

## 2014-08-10 NOTE — Telephone Encounter (Signed)
Closed encounter °

## 2014-08-13 ENCOUNTER — Encounter: Payer: Self-pay | Admitting: *Deleted

## 2014-08-24 ENCOUNTER — Ambulatory Visit (INDEPENDENT_AMBULATORY_CARE_PROVIDER_SITE_OTHER): Payer: Medicare Other

## 2014-08-24 ENCOUNTER — Ambulatory Visit (INDEPENDENT_AMBULATORY_CARE_PROVIDER_SITE_OTHER): Payer: Medicare Other | Admitting: Cardiovascular Disease

## 2014-08-24 ENCOUNTER — Encounter: Payer: Self-pay | Admitting: Cardiovascular Disease

## 2014-08-24 VITALS — BP 126/64 | HR 79 | Ht 63.5 in | Wt 124.8 lb

## 2014-08-24 DIAGNOSIS — Z7901 Long term (current) use of anticoagulants: Secondary | ICD-10-CM | POA: Diagnosis not present

## 2014-08-24 DIAGNOSIS — I10 Essential (primary) hypertension: Secondary | ICD-10-CM | POA: Diagnosis not present

## 2014-08-24 DIAGNOSIS — Z954 Presence of other heart-valve replacement: Secondary | ICD-10-CM

## 2014-08-24 DIAGNOSIS — Z952 Presence of prosthetic heart valve: Secondary | ICD-10-CM

## 2014-08-24 DIAGNOSIS — E785 Hyperlipidemia, unspecified: Secondary | ICD-10-CM | POA: Diagnosis not present

## 2014-08-24 LAB — POCT INR: INR: 3.3

## 2014-08-24 NOTE — Progress Notes (Signed)
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 Jude mechanical valve by Dr. Lilly Cove October 1998 for what sounds like bicuspid aortic stenosis. She is on chronic Coumadin anticoagulation. Her other problems include hypertension and hyperlipidemia. She is aware of SBE prophylaxis. She denies chest pain or shortness of breath. Her last echo performed December 12, 2010, showed a well-functioning aortic mechanical prosthesis.Dr. Shelia Media follows her lipid profile closely. since I saw her one year ago she's remained clinically stable and specifically denies chest pain, shortness of breath or syncope.   Current Outpatient Prescriptions  Medication Sig Dispense Refill  . amLODipine (NORVASC) 2.5 MG tablet Take 2.5 mg by mouth daily.    . Ascorbic Acid (VITAMIN C) 100 MG tablet Take 100 mg by mouth daily.    [name] atorvastatin (LIPITOR) 10 MG tablet Take 10 mg by mouth every other day.    . B Complex Vitamins (B COMPLEX 100 PO) Take 100 mg by mouth daily.    . Calcium Carbonate-Vitamin D 600-200 MG-UNIT TABS Take 1 tablet by mouth daily.    . hydrochlorothiazide (HYDRODIURIL) 25 MG tablet Take 25 mg by mouth daily.    . metoprolol (LOPRESSOR) 50 MG tablet Take 25 mg by mouth daily.    . vitamin E 400 UNIT capsule Take 400 Units by mouth daily.    [name] warfarin (COUMADIN) 2.5 MG tablet Take 1 to 1.5 tablets by mouth daily or as directed 120 tablet 1   No current facility-administered medications for this visit.    No Known Allergies  History   Social History  . Marital Status: Married    Spouse  Name: N/A  . Number of Children: N/A  . Years of Education: N/A   Occupational History  . Not on file.   Social History Main Topics  . Smoking status: Never Smoker   . Smokeless tobacco: Not on file  . Alcohol Use: Not on file  . Drug Use: Not on file  . Sexual Activity: Not on file   Other Topics Concern  . Not on file   Social History Narrative     Review of Systems: General: negative for chills, fever, night sweats or weight changes.  Cardiovascular: negative for chest pain, dyspnea on exertion, edema, orthopnea, palpitations, paroxysmal nocturnal dyspnea or shortness of breath Dermatological: negative for rash Respiratory: negative for cough or wheezing Urologic: negative for hematuria Abdominal: negative for nausea, vomiting, diarrhea, bright red blood per rectum, melena, or hematemesis Neurologic: negative for visual changes, syncope, or dizziness All other systems reviewed and are otherwise negative except as noted above.    Blood pressure 126/64, pulse 79, height 5' 3.5" (1.613 m), weight 124 lb 12.8 oz (56.609 kg).  General appearance: alert and no distress Neck: no adenopathy, no carotid bruit, no JVD, supple, symmetrical, trachea midline and thyroid not enlarged, symmetric, no tenderness/mass/nodules Lungs: clear to auscultation bilaterally Heart: crisp prosthetic valve sounds Extremities: extremities normal, atraumatic, no cyanosis or edema  EKG normal sinus rhythm at 79 with nonspecific ST-T wave changes and sinus arrhythmia. I personally reviewed this EKG  ASSESSMENT AND PLAN:  Hyperlipidemia History of hyperlipidemia on atorvastatin 10 mg a day followed by her PCP   Essential hypertension History of hypertension blood pressure measured at 126/64. She is on amlodipine, hydrochlorothiazide and metoprolol. Continue current meds at current dosing   H/O aortic valve replacement History of St. Jude aortic valve replacement by Dr.Cub Roxy Manns October 1998 for  what sounds like a bicuspid aortic valve. She is on Coumadin and coagulation which we check here in our practice. Her last 2-D echo performed one year ago showed normal LV systolic function with a well-functioning aortic mechanical prosthesis. I'm going to recheck a 2-D echocardiogram       Lorretta Harp MD [facility], [facility] 08/24/2014 12:41 PM

## 2014-08-24 NOTE — Assessment & Plan Note (Signed)
History of St. Jude aortic valve replacement by Dr.Cub Roxy Manns October 1998 for what sounds like a bicuspid aortic valve. She is on Coumadin and coagulation which we check here in our practice. Her last 2-D echo performed one year ago showed normal LV systolic function with a well-functioning aortic mechanical prosthesis. I'm going to recheck a 2-D echocardiogram

## 2014-08-24 NOTE — Assessment & Plan Note (Signed)
History of hyperlipidemia on atorvastatin 10 mg a day followed by her PCP

## 2014-08-24 NOTE — Assessment & Plan Note (Signed)
History of hypertension blood pressure measured at 126/64. She is on amlodipine, hydrochlorothiazide and metoprolol. Continue current meds at current dosing

## 2014-08-24 NOTE — Patient Instructions (Signed)
Your physician has requested that you have an echocardiogram. Echocardiography is a painless test that uses sound waves to create images of your heart. It provides your doctor with information about the size and shape of your heart and how well your heart's chambers and valves are working. This procedure takes approximately one hour. There are no restrictions for this procedure.  Dr Gwenlyn Found recommends that you schedule a follow-up appointment in 1 year. You will receive a reminder letter in the mail two months in advance. If you don't receive a letter, please call our office to schedule the follow-up appointment.

## 2014-09-08 ENCOUNTER — Encounter: Payer: Self-pay | Admitting: Cardiovascular Disease

## 2014-09-22 ENCOUNTER — Ambulatory Visit (INDEPENDENT_AMBULATORY_CARE_PROVIDER_SITE_OTHER): Payer: Medicare Other | Admitting: Pharmacist

## 2014-09-22 DIAGNOSIS — Z954 Presence of other heart-valve replacement: Secondary | ICD-10-CM

## 2014-09-22 DIAGNOSIS — Z7901 Long term (current) use of anticoagulants: Secondary | ICD-10-CM | POA: Diagnosis not present

## 2014-09-22 DIAGNOSIS — Z952 Presence of prosthetic heart valve: Secondary | ICD-10-CM

## 2014-09-22 LAB — PROTIME-INR: INR: 2.6 — AB (ref 0.9–1.1)

## 2014-10-19 ENCOUNTER — Ambulatory Visit (HOSPITAL_COMMUNITY)
Admission: RE | Admit: 2014-10-19 | Discharge: 2014-10-19 | Disposition: A | Discharge status: Home / Self Care | Payer: Medicare Other | Source: Ambulatory Visit | Attending: Cardiovascular Disease | Admitting: Cardiovascular Disease

## 2014-10-19 DIAGNOSIS — Z954 Presence of other heart-valve replacement: Secondary | ICD-10-CM

## 2014-10-19 DIAGNOSIS — I10 Essential (primary) hypertension: Secondary | ICD-10-CM | POA: Insufficient documentation

## 2014-10-19 DIAGNOSIS — I358 Other nonrheumatic aortic valve disorders: Secondary | ICD-10-CM | POA: Diagnosis present

## 2014-10-19 DIAGNOSIS — Z952 Presence of prosthetic heart valve: Secondary | ICD-10-CM | POA: Diagnosis not present

## 2014-10-19 DIAGNOSIS — I340 Nonrheumatic mitral (valve) insufficiency: Secondary | ICD-10-CM | POA: Insufficient documentation

## 2014-10-27 ENCOUNTER — Encounter: Payer: Self-pay | Admitting: *Deleted

## 2014-11-15 ENCOUNTER — Other Ambulatory Visit: Payer: Self-pay

## 2014-11-15 ENCOUNTER — Ambulatory Visit (INDEPENDENT_AMBULATORY_CARE_PROVIDER_SITE_OTHER): Payer: Medicare Other | Admitting: Pharmacist Clinician (PhC)/ Clinical Pharmacy Specialist

## 2014-11-15 DIAGNOSIS — Z954 Presence of other heart-valve replacement: Secondary | ICD-10-CM

## 2014-11-15 DIAGNOSIS — Z7901 Long term (current) use of anticoagulants: Secondary | ICD-10-CM | POA: Diagnosis not present

## 2014-11-15 DIAGNOSIS — Z1231 Encounter for screening mammogram for malignant neoplasm of breast: Secondary | ICD-10-CM

## 2014-11-15 DIAGNOSIS — Z952 Presence of prosthetic heart valve: Secondary | ICD-10-CM

## 2014-11-15 LAB — PROTIME-INR
INR: 2.1 — ABNORMAL HIGH (ref 0.8–1.2)
Prothrombin Time: 23.1 s — ABNORMAL HIGH (ref 9.1–12.0)

## 2014-11-22 ENCOUNTER — Ambulatory Visit
Admission: RE | Admit: 2014-11-22 | Discharge: 2014-11-22 | Disposition: A | Discharge status: Home / Self Care | Payer: Medicare Other | Source: Ambulatory Visit

## 2014-11-22 DIAGNOSIS — Z1231 Encounter for screening mammogram for malignant neoplasm of breast: Secondary | ICD-10-CM | POA: Diagnosis not present

## 2014-12-01 DIAGNOSIS — I10 Essential (primary) hypertension: Secondary | ICD-10-CM | POA: Diagnosis not present

## 2014-12-01 DIAGNOSIS — E780 Pure hypercholesterolemia: Secondary | ICD-10-CM | POA: Diagnosis not present

## 2014-12-09 ENCOUNTER — Telehealth: Payer: Self-pay | Admitting: Cardiovascular Disease

## 2014-12-09 NOTE — Telephone Encounter (Signed)
Did not need to start an encounter   Close encounter

## 2014-12-13 DIAGNOSIS — Z7901 Long term (current) use of anticoagulants: Secondary | ICD-10-CM | POA: Diagnosis not present

## 2014-12-13 LAB — PROTIME-INR: INR: 3 — AB (ref 0.9–1.1)

## 2014-12-14 ENCOUNTER — Ambulatory Visit (INDEPENDENT_AMBULATORY_CARE_PROVIDER_SITE_OTHER): Payer: Medicare Other | Admitting: Pharmacist Clinician (PhC)/ Clinical Pharmacy Specialist

## 2014-12-14 DIAGNOSIS — Z952 Presence of prosthetic heart valve: Secondary | ICD-10-CM

## 2014-12-14 DIAGNOSIS — Z954 Presence of other heart-valve replacement: Secondary | ICD-10-CM

## 2015-01-19 ENCOUNTER — Ambulatory Visit (INDEPENDENT_AMBULATORY_CARE_PROVIDER_SITE_OTHER): Payer: Medicare Other | Admitting: Pharmacist Clinician (PhC)/ Clinical Pharmacy Specialist

## 2015-01-19 DIAGNOSIS — Z954 Presence of other heart-valve replacement: Secondary | ICD-10-CM

## 2015-01-19 DIAGNOSIS — Z7901 Long term (current) use of anticoagulants: Secondary | ICD-10-CM | POA: Diagnosis not present

## 2015-01-19 DIAGNOSIS — Z952 Presence of prosthetic heart valve: Secondary | ICD-10-CM

## 2015-01-19 LAB — PROTIME-INR: INR: 3.2 — AB (ref 0.9–1.1)

## 2015-02-21 DIAGNOSIS — Z7901 Long term (current) use of anticoagulants: Secondary | ICD-10-CM | POA: Diagnosis not present

## 2015-02-21 LAB — PROTIME-INR: INR: 1.9 — AB (ref 0.9–1.1)

## 2015-02-22 ENCOUNTER — Ambulatory Visit (INDEPENDENT_AMBULATORY_CARE_PROVIDER_SITE_OTHER): Payer: Medicare Other | Admitting: Pharmacist Clinician (PhC)/ Clinical Pharmacy Specialist

## 2015-02-22 DIAGNOSIS — Z954 Presence of other heart-valve replacement: Secondary | ICD-10-CM

## 2015-02-22 DIAGNOSIS — Z7901 Long term (current) use of anticoagulants: Secondary | ICD-10-CM

## 2015-02-22 DIAGNOSIS — Z952 Presence of prosthetic heart valve: Secondary | ICD-10-CM

## 2015-03-07 ENCOUNTER — Ambulatory Visit (INDEPENDENT_AMBULATORY_CARE_PROVIDER_SITE_OTHER): Payer: Medicare Other | Admitting: Pharmacist Clinician (PhC)/ Clinical Pharmacy Specialist

## 2015-03-07 DIAGNOSIS — Z7901 Long term (current) use of anticoagulants: Secondary | ICD-10-CM | POA: Diagnosis not present

## 2015-03-07 DIAGNOSIS — Z954 Presence of other heart-valve replacement: Secondary | ICD-10-CM

## 2015-03-07 DIAGNOSIS — Z952 Presence of prosthetic heart valve: Secondary | ICD-10-CM

## 2015-03-07 LAB — PROTIME-INR: INR: 2.3 — AB (ref 0.9–1.1)

## 2015-03-22 DIAGNOSIS — Z7901 Long term (current) use of anticoagulants: Secondary | ICD-10-CM | POA: Diagnosis not present

## 2015-03-22 LAB — PROTIME-INR: INR: 3.6 — AB (ref <=1.1)

## 2015-03-24 ENCOUNTER — Ambulatory Visit (INDEPENDENT_AMBULATORY_CARE_PROVIDER_SITE_OTHER): Payer: Medicare Other | Admitting: Pharmacist Clinician (PhC)/ Clinical Pharmacy Specialist

## 2015-03-24 DIAGNOSIS — Z7901 Long term (current) use of anticoagulants: Secondary | ICD-10-CM

## 2015-03-24 DIAGNOSIS — Z952 Presence of prosthetic heart valve: Secondary | ICD-10-CM

## 2015-03-24 DIAGNOSIS — Z954 Presence of other heart-valve replacement: Secondary | ICD-10-CM

## 2015-03-31 ENCOUNTER — Encounter: Payer: Self-pay | Admitting: Cardiovascular Disease

## 2015-04-25 DIAGNOSIS — C44519 Basal cell carcinoma of skin of other part of trunk: Secondary | ICD-10-CM | POA: Diagnosis not present

## 2015-04-26 ENCOUNTER — Ambulatory Visit (INDEPENDENT_AMBULATORY_CARE_PROVIDER_SITE_OTHER): Payer: Medicare Other | Admitting: Pharmacist Clinician (PhC)/ Clinical Pharmacy Specialist

## 2015-04-26 DIAGNOSIS — Z954 Presence of other heart-valve replacement: Secondary | ICD-10-CM

## 2015-04-26 DIAGNOSIS — Z952 Presence of prosthetic heart valve: Secondary | ICD-10-CM

## 2015-04-26 DIAGNOSIS — Z7901 Long term (current) use of anticoagulants: Secondary | ICD-10-CM

## 2015-04-26 LAB — PROTIME-INR: INR: 3 — AB (ref 0.9–1.1)

## 2015-04-28 ENCOUNTER — Telehealth: Payer: Self-pay | Admitting: Pharmacist Clinician (PhC)/ Clinical Pharmacy Specialist

## 2015-04-28 NOTE — Telephone Encounter (Signed)
Patient concerned because has to have basal cell cancer removed, wasn't sure if she needed to stop warfarin.  Explained that as a rule we don't hold warfarin for this, her dermatologist can contact us if they have any concerns.  Patient voiced understanding.

## 2015-04-28 NOTE — Telephone Encounter (Signed)
New problem    Pt need to speak w/you concerning her coumadin.

## 2015-05-04 DIAGNOSIS — D045 Carcinoma in situ of skin of trunk: Secondary | ICD-10-CM | POA: Diagnosis not present

## 2015-05-23 DIAGNOSIS — Z7901 Long term (current) use of anticoagulants: Secondary | ICD-10-CM | POA: Diagnosis not present

## 2015-05-23 LAB — PROTIME-INR: INR: 3 — AB (ref 0.9–1.1)

## 2015-05-24 ENCOUNTER — Ambulatory Visit (INDEPENDENT_AMBULATORY_CARE_PROVIDER_SITE_OTHER): Payer: Medicare Other | Admitting: Pharmacist Clinician (PhC)/ Clinical Pharmacy Specialist

## 2015-05-24 DIAGNOSIS — Z952 Presence of prosthetic heart valve: Secondary | ICD-10-CM

## 2015-05-24 DIAGNOSIS — Z954 Presence of other heart-valve replacement: Secondary | ICD-10-CM

## 2015-05-24 DIAGNOSIS — Z7901 Long term (current) use of anticoagulants: Secondary | ICD-10-CM

## 2015-05-31 DIAGNOSIS — H04123 Dry eye syndrome of bilateral lacrimal glands: Secondary | ICD-10-CM | POA: Diagnosis not present

## 2015-06-14 DIAGNOSIS — I10 Essential (primary) hypertension: Secondary | ICD-10-CM | POA: Diagnosis not present

## 2015-06-14 DIAGNOSIS — Z0000 Encounter for general adult medical examination without abnormal findings: Secondary | ICD-10-CM | POA: Diagnosis not present

## 2015-06-14 DIAGNOSIS — E559 Vitamin D deficiency, unspecified: Secondary | ICD-10-CM | POA: Diagnosis not present

## 2015-06-14 DIAGNOSIS — E7800 Pure hypercholesterolemia, unspecified: Secondary | ICD-10-CM | POA: Diagnosis not present

## 2015-06-14 DIAGNOSIS — N390 Urinary tract infection, site not specified: Secondary | ICD-10-CM | POA: Diagnosis not present

## 2015-06-14 DIAGNOSIS — J329 Chronic sinusitis, unspecified: Secondary | ICD-10-CM | POA: Diagnosis not present

## 2015-06-22 DIAGNOSIS — Z23 Encounter for immunization: Secondary | ICD-10-CM | POA: Diagnosis not present

## 2015-06-22 DIAGNOSIS — E7800 Pure hypercholesterolemia, unspecified: Secondary | ICD-10-CM | POA: Diagnosis not present

## 2015-06-22 DIAGNOSIS — I2510 Atherosclerotic heart disease of native coronary artery without angina pectoris: Secondary | ICD-10-CM | POA: Diagnosis not present

## 2015-06-22 DIAGNOSIS — J309 Allergic rhinitis, unspecified: Secondary | ICD-10-CM | POA: Diagnosis not present

## 2015-06-22 DIAGNOSIS — N8110 Cystocele, unspecified: Secondary | ICD-10-CM | POA: Diagnosis not present

## 2015-06-22 DIAGNOSIS — Z1212 Encounter for screening for malignant neoplasm of rectum: Secondary | ICD-10-CM | POA: Diagnosis not present

## 2015-06-22 DIAGNOSIS — K5900 Constipation, unspecified: Secondary | ICD-10-CM | POA: Diagnosis not present

## 2015-06-27 DIAGNOSIS — Z7901 Long term (current) use of anticoagulants: Secondary | ICD-10-CM | POA: Diagnosis not present

## 2015-06-27 LAB — PROTIME-INR: INR: 2.1 — AB (ref 0.9–1.1)

## 2015-06-28 ENCOUNTER — Ambulatory Visit (INDEPENDENT_AMBULATORY_CARE_PROVIDER_SITE_OTHER): Payer: Medicare Other | Admitting: Pharmacist Clinician (PhC)/ Clinical Pharmacy Specialist

## 2015-06-28 DIAGNOSIS — Z7901 Long term (current) use of anticoagulants: Secondary | ICD-10-CM

## 2015-06-28 DIAGNOSIS — Z954 Presence of other heart-valve replacement: Secondary | ICD-10-CM

## 2015-06-28 DIAGNOSIS — Z952 Presence of prosthetic heart valve: Secondary | ICD-10-CM

## 2015-07-08 ENCOUNTER — Other Ambulatory Visit: Payer: Self-pay | Admitting: Pharmacist Clinician (PhC)/ Clinical Pharmacy Specialist

## 2015-07-08 MED ORDER — WARFARIN SODIUM 2.5 MG PO TABS: ORAL_TABLET | ORAL | Status: DC

## 2015-07-18 DIAGNOSIS — Z7901 Long term (current) use of anticoagulants: Secondary | ICD-10-CM | POA: Diagnosis not present

## 2015-07-18 LAB — PROTIME-INR: INR: 2.3 — AB (ref 0.9–1.1)

## 2015-07-20 ENCOUNTER — Ambulatory Visit (INDEPENDENT_AMBULATORY_CARE_PROVIDER_SITE_OTHER): Payer: Medicare Other | Admitting: Pharmacist Clinician (PhC)/ Clinical Pharmacy Specialist

## 2015-07-20 DIAGNOSIS — Z954 Presence of other heart-valve replacement: Secondary | ICD-10-CM

## 2015-07-20 DIAGNOSIS — Z952 Presence of prosthetic heart valve: Secondary | ICD-10-CM

## 2015-07-20 DIAGNOSIS — Z7901 Long term (current) use of anticoagulants: Secondary | ICD-10-CM

## 2015-08-05 DIAGNOSIS — N3090 Cystitis, unspecified without hematuria: Secondary | ICD-10-CM | POA: Diagnosis not present

## 2015-08-05 DIAGNOSIS — N3000 Acute cystitis without hematuria: Secondary | ICD-10-CM | POA: Diagnosis not present

## 2015-08-15 ENCOUNTER — Ambulatory Visit (INDEPENDENT_AMBULATORY_CARE_PROVIDER_SITE_OTHER): Payer: Medicare Other | Admitting: Pharmacist Clinician (PhC)/ Clinical Pharmacy Specialist

## 2015-08-15 DIAGNOSIS — Z7901 Long term (current) use of anticoagulants: Secondary | ICD-10-CM | POA: Diagnosis not present

## 2015-08-15 DIAGNOSIS — Z952 Presence of prosthetic heart valve: Secondary | ICD-10-CM

## 2015-08-15 DIAGNOSIS — Z954 Presence of other heart-valve replacement: Secondary | ICD-10-CM

## 2015-08-15 LAB — PROTIME-INR: INR: 3.1 — AB (ref 0.9–1.1)

## 2015-09-01 ENCOUNTER — Telehealth: Payer: Self-pay | Admitting: *Deleted

## 2015-09-01 NOTE — Telephone Encounter (Signed)
[facility] renewal order for Prothrombin Time biweekly signed by MD and faxed.

## 2015-09-05 ENCOUNTER — Ambulatory Visit (INDEPENDENT_AMBULATORY_CARE_PROVIDER_SITE_OTHER): Payer: Medicare Other | Admitting: Pharmacist

## 2015-09-05 DIAGNOSIS — Z7901 Long term (current) use of anticoagulants: Secondary | ICD-10-CM | POA: Diagnosis not present

## 2015-09-05 DIAGNOSIS — Z954 Presence of other heart-valve replacement: Secondary | ICD-10-CM

## 2015-09-05 DIAGNOSIS — Z952 Presence of prosthetic heart valve: Secondary | ICD-10-CM

## 2015-09-05 LAB — PROTIME-INR: INR: 2.8 — AB (ref 0.9–1.1)

## 2015-09-26 DIAGNOSIS — S60562A Insect bite (nonvenomous) of left hand, initial encounter: Secondary | ICD-10-CM | POA: Diagnosis not present

## 2015-09-26 DIAGNOSIS — S60561A Insect bite (nonvenomous) of right hand, initial encounter: Secondary | ICD-10-CM | POA: Diagnosis not present

## 2015-10-10 ENCOUNTER — Ambulatory Visit (INDEPENDENT_AMBULATORY_CARE_PROVIDER_SITE_OTHER): Payer: Medicare Other | Admitting: Pharmacist Clinician (PhC)/ Clinical Pharmacy Specialist

## 2015-10-10 DIAGNOSIS — Z7901 Long term (current) use of anticoagulants: Secondary | ICD-10-CM | POA: Diagnosis not present

## 2015-10-10 DIAGNOSIS — Z952 Presence of prosthetic heart valve: Secondary | ICD-10-CM

## 2015-10-10 DIAGNOSIS — Z954 Presence of other heart-valve replacement: Secondary | ICD-10-CM

## 2015-10-10 LAB — PROTIME-INR: INR: 2.4 — AB (ref 0.9–1.1)

## 2015-10-24 DIAGNOSIS — L259 Unspecified contact dermatitis, unspecified cause: Secondary | ICD-10-CM | POA: Diagnosis not present

## 2015-10-25 ENCOUNTER — Encounter: Payer: Self-pay | Admitting: Cardiovascular Disease

## 2015-10-25 ENCOUNTER — Ambulatory Visit (INDEPENDENT_AMBULATORY_CARE_PROVIDER_SITE_OTHER): Payer: Medicare Other | Admitting: Cardiovascular Disease

## 2015-10-25 VITALS — BP 152/68 | HR 64

## 2015-10-25 DIAGNOSIS — I10 Essential (primary) hypertension: Secondary | ICD-10-CM

## 2015-10-25 DIAGNOSIS — Z954 Presence of other heart-valve replacement: Secondary | ICD-10-CM

## 2015-10-25 DIAGNOSIS — I359 Nonrheumatic aortic valve disorder, unspecified: Secondary | ICD-10-CM

## 2015-10-25 DIAGNOSIS — Z952 Presence of prosthetic heart valve: Secondary | ICD-10-CM

## 2015-10-25 NOTE — Assessment & Plan Note (Signed)
History of St. Jude aVR performed by Dr. Roxy Manns October 1998 for what sounds like a bicuspid aortic stenosis. She gets 2-D echo was performed on an annual basis most recently performed 10/19/14 revealing normal LV systolic function and a normally functioning St. Jude aortic valve. She is on Coumadin anticoagulation which is followed here in our office.

## 2015-10-25 NOTE — Assessment & Plan Note (Signed)
History of hyperlipidemia on atorvastatin followed by her PCP 

## 2015-10-25 NOTE — Progress Notes (Signed)
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 Jude mechanical valve by Dr. Lilly Cove October 1998 for what sounds like bicuspid aortic stenosis. She is on chronic Coumadin anticoagulation. Her other problems include hypertension and hyperlipidemia. She is aware of SBE prophylaxis. She denies chest pain or shortness of breath. Her last echo performed December 12, 2010, showed a well-functioning aortic mechanical prosthesis.Dr. Shelia Media follows her lipid profile closely. Since I saw her one year ago she's remained clinically stable and specifically denies chest pain, shortness of breath or syncope.   Current Outpatient Prescriptions  Medication Sig Dispense Refill  . amLODipine (NORVASC) 2.5 MG tablet Take 2.5 mg by mouth daily.    . Ascorbic Acid (VITAMIN C) 100 MG tablet Take 100 mg by mouth daily.    [name] atorvastatin (LIPITOR) 10 MG tablet Take 10 mg by mouth every other day.    . B Complex Vitamins (B COMPLEX 100 PO) Take 100 mg by mouth daily.    . Calcium Carbonate-Vitamin D 600-200 MG-UNIT TABS Take 1 tablet by mouth daily.    . hydrochlorothiazide (HYDRODIURIL) 25 MG tablet Take 25 mg by mouth daily.    . metoprolol (LOPRESSOR) 50 MG tablet Take 25 mg by mouth daily.    . vitamin E 400 UNIT capsule Take 400 Units by mouth daily.    [name] warfarin (COUMADIN) 2.5 MG tablet Take 1 to 1.5 tablets by mouth daily or as directed 120 tablet 1   No current facility-administered medications for this visit.     No Known Allergies  Social History   Social History  . Marital status: Married   Spouse name: N/A  . Number of children: N/A  . Years of education: N/A   Occupational History  . Not on file.   Social History Main Topics  . Smoking status: Never Smoker  . Smokeless tobacco: Not on file  . Alcohol use Not on file  . Drug use: Unknown  . Sexual activity: Not on file   Other Topics Concern  . Not on file   Social History Narrative  . No narrative on file     Review of Systems: General: negative for chills, fever, night sweats or weight changes.  Cardiovascular: negative for chest pain, dyspnea on exertion, edema, orthopnea, palpitations, paroxysmal nocturnal dyspnea or shortness of breath Dermatological: negative for rash Respiratory: negative for cough or wheezing Urologic: negative for hematuria Abdominal: negative for nausea, vomiting, diarrhea, bright red blood per rectum, melena, or hematemesis Neurologic: negative for visual changes, syncope, or dizziness All other systems reviewed and are otherwise negative except as noted above.    Blood pressure (!) 152/68, pulse 64.  General appearance: alert and no distress Neck: no adenopathy, no carotid bruit, no JVD, supple, symmetrical, trachea midline and thyroid not enlarged, symmetric, no tenderness/mass/nodules Lungs: clear to auscultation bilaterally Heart: Crisp prosthetic valve sounds Extremities: Normal sinus rhythm at 64 with inferolateral T-wave inversion inversion and septal Q waves with left ventricular hypertrophy. This may represent repolarization abnormality. I personally reviewed this EKG  EKG normal sinus rhythm at 64 with evidence of LVH with  repolarization abnormality and septal Q waves. I personally reviewed his EKG  ASSESSMENT AND PLAN:   H/O aortic valve replacement History of St. Jude aVR performed by Dr. Roxy Manns October 1998 for what sounds like a bicuspid aortic stenosis. She gets 2-D echo was performed on an annual basis most recently performed 10/19/14 revealing normal LV systolic  function and a normally functioning St. Jude aortic valve. She is on Coumadin anticoagulation which is followed here in our office.  Essential hypertension History of hypertension with blood pressure measured 152/68. She is on amlodipine, hydrochlorothiazide and metoprolol. Continue current meds at current dosing  Hyperlipidemia History of hyperlipidemia on atorvastatin followed by her PCP      Lorretta Harp MD [facility], [facility] 10/25/2015 9:29 AM

## 2015-10-25 NOTE — Assessment & Plan Note (Signed)
History of hypertension with blood pressure measured 152/68. She is on amlodipine, hydrochlorothiazide and metoprolol. Continue current meds at current dosing

## 2015-10-25 NOTE — Patient Instructions (Signed)
Medication Instructions:  Your physician recommends that you continue on your current medications as directed. Please refer to the Current Medication list given to you today.   Labwork: N/A  Testing/Procedures: Your physician has requested that you have an echocardiogram. Echocardiography is a painless test that uses sound waves to create images of your heart. It provides your doctor with information about the size and shape of your heart and how well your heart's chambers and valves are working. This procedure takes approximately one hour. There are no restrictions for this procedure.   Follow-Up: Your physician wants you to follow-up in: [place]. You will receive a reminder letter in the mail two months in advance. If you don't receive a letter, please call our office to schedule the follow-up appointment.   Echocardiogram An echocardiogram, or echocardiography, uses sound waves (ultrasound) to produce an image of your heart. The echocardiogram is simple, painless, obtained within a short period of time, and offers valuable information to your health care provider. The images from an echocardiogram can provide information such as:  Evidence of coronary artery disease (CAD).  Heart size.  Heart muscle function.  Heart valve function.  Aneurysm detection.  Evidence of a past heart attack.  Fluid buildup around the heart.  Heart muscle thickening.  Assess heart valve function. LET [facility] CARE PROVIDER KNOW ABOUT:  Any allergies you have.  All medicines you are taking, including vitamins, herbs, eye drops, creams, and over-the-counter medicines.  Previous problems you or members of your family have had with the use of anesthetics.  Any blood disorders you have.  Previous surgeries you have had.  Medical conditions you have.  Possibility of pregnancy, if this applies. BEFORE THE PROCEDURE  No special preparation is needed. Eat and drink normally.   PROCEDURE   In order to produce an image of your heart, gel will be applied to your chest and a wand-like tool (transducer) will be moved over your chest. The gel will help transmit the sound waves from the transducer. The sound waves will harmlessly bounce off your heart to allow the heart images to be captured in real-time motion. These images will then be recorded.  You may need an IV to receive a medicine that improves the quality of the pictures. AFTER THE PROCEDURE You may return to your normal schedule including diet, activities, and medicines, unless your health care provider tells you otherwise.   This information is not intended to replace advice given to you by your health care provider. Make sure you discuss any questions you have with your health care provider.   Document Released: 03/16/2000 Document Revised: 04/09/2014 Document Reviewed: 11/24/2012 Elsevier Interactive Patient Education [employer].  If you need a refill on your cardiac medications before your next appointment, please call your pharmacy.

## 2015-11-07 DIAGNOSIS — Z7901 Long term (current) use of anticoagulants: Secondary | ICD-10-CM | POA: Diagnosis not present

## 2015-11-07 LAB — PROTIME-INR: INR: 2.8 — AB (ref 0.9–1.1)

## 2015-11-08 ENCOUNTER — Ambulatory Visit (INDEPENDENT_AMBULATORY_CARE_PROVIDER_SITE_OTHER): Payer: Medicare Other | Admitting: Pharmacist

## 2015-11-08 ENCOUNTER — Other Ambulatory Visit (HOSPITAL_COMMUNITY): Payer: Medicare Other

## 2015-11-08 DIAGNOSIS — Z952 Presence of prosthetic heart valve: Secondary | ICD-10-CM

## 2015-11-08 DIAGNOSIS — Z954 Presence of other heart-valve replacement: Secondary | ICD-10-CM

## 2015-11-08 DIAGNOSIS — Z7901 Long term (current) use of anticoagulants: Secondary | ICD-10-CM

## 2015-11-15 ENCOUNTER — Other Ambulatory Visit: Payer: Self-pay

## 2015-11-15 ENCOUNTER — Ambulatory Visit (HOSPITAL_COMMUNITY): Payer: Medicare Other | Attending: Cardiovascular Disease

## 2015-11-15 ENCOUNTER — Encounter (INDEPENDENT_AMBULATORY_CARE_PROVIDER_SITE_OTHER): Payer: Self-pay

## 2015-11-15 DIAGNOSIS — Z952 Presence of prosthetic heart valve: Secondary | ICD-10-CM | POA: Insufficient documentation

## 2015-11-15 DIAGNOSIS — I10 Essential (primary) hypertension: Secondary | ICD-10-CM | POA: Diagnosis not present

## 2015-11-15 DIAGNOSIS — I119 Hypertensive heart disease without heart failure: Secondary | ICD-10-CM | POA: Diagnosis not present

## 2015-11-15 DIAGNOSIS — Z954 Presence of other heart-valve replacement: Secondary | ICD-10-CM

## 2015-11-15 DIAGNOSIS — I359 Nonrheumatic aortic valve disorder, unspecified: Secondary | ICD-10-CM | POA: Diagnosis not present

## 2015-11-20 IMAGING — CT CT CHEST W/ CM
2 of 3 series · 15 of 36 positions shown, 18 images · IV contrast (75CC OMNI 300)
Comparison: 11/11/2013

CLINICAL DATA: Abnormal chest x-ray left lung base

EXAM:
CT CHEST WITH CONTRAST
TECHNIQUE: Multidetector CT imaging of the chest was performed during
intravenous contrast administration.
CONTRAST:  75mL OMNIPAQUE IOHEXOL 300 MG/ML  SOLN

[Series 2: chest with · axial · 0.62mm/px · z∈[-299,-29]mm · 12 of 64 slices shown, 15 images]
[im 5/64  mediastinal]
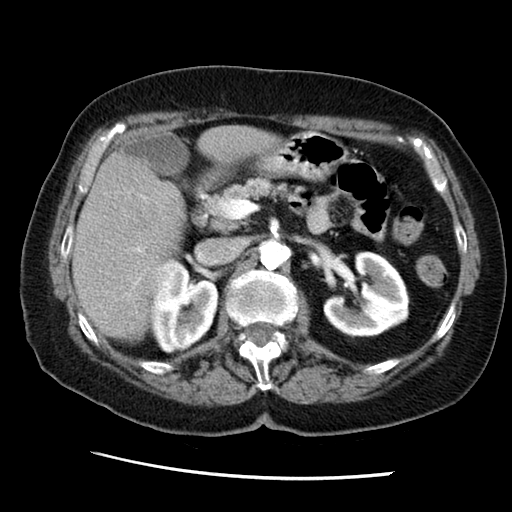
[im 5/64  lung]
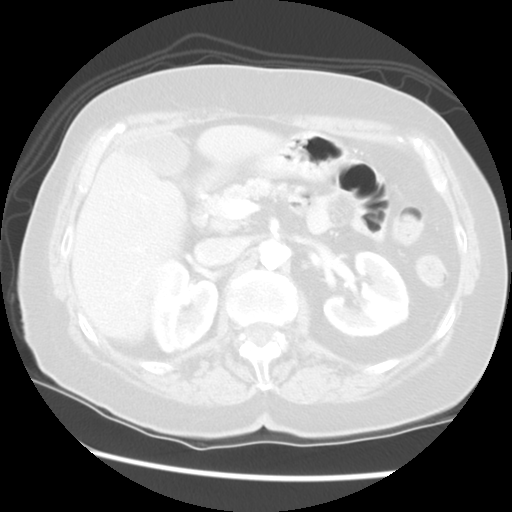
[im 10/64  lung]
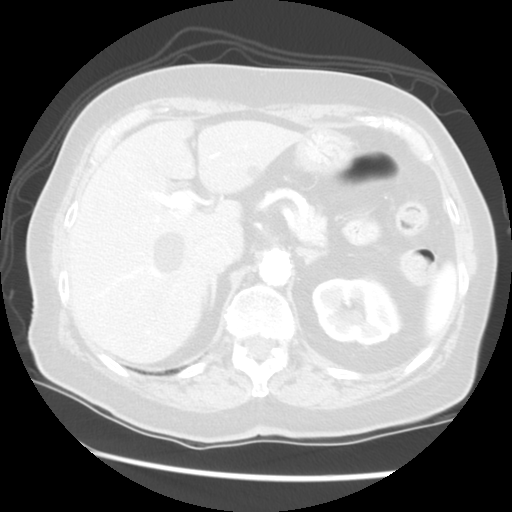
[im 15/64  lung]
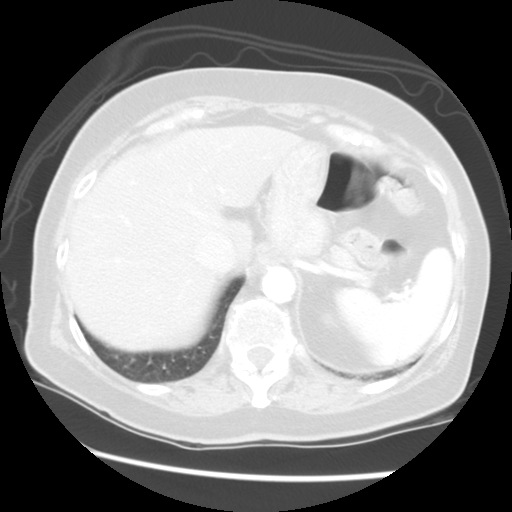
[im 19/64  lung]
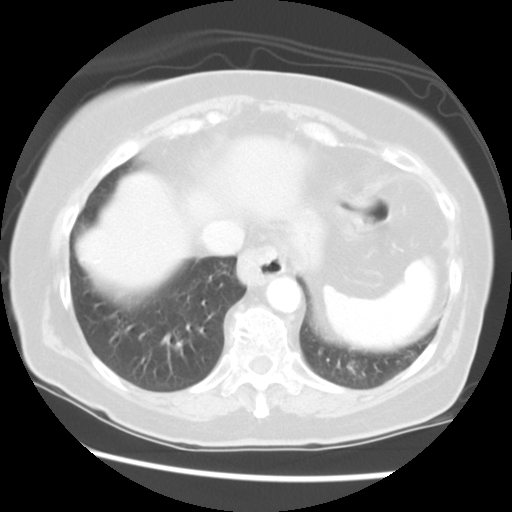
[im 24/64  mediastinal]
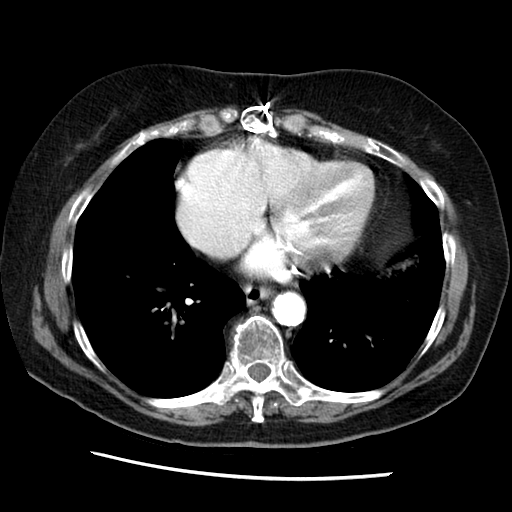
[im 24/64  lung]
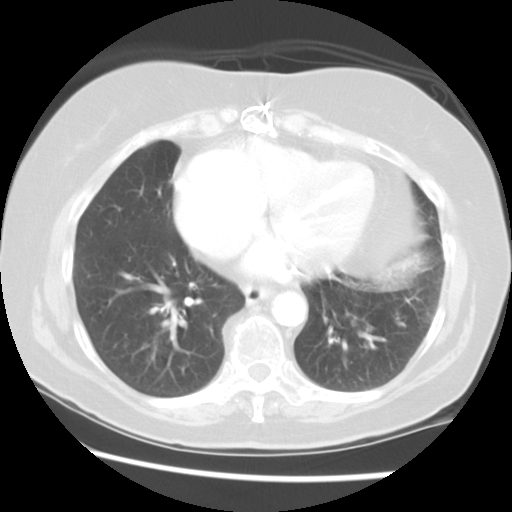
[im 29/64  lung]
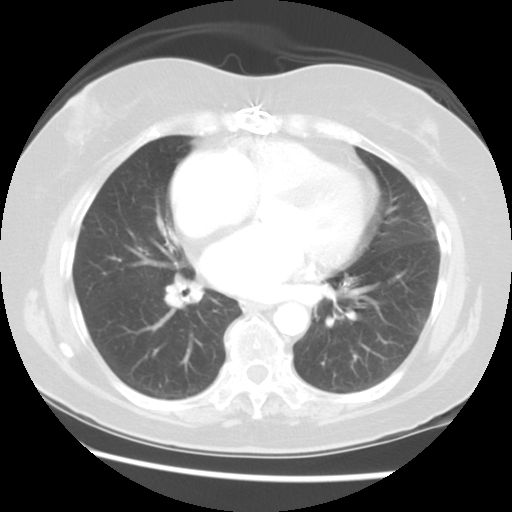
[im 36/64  lung]
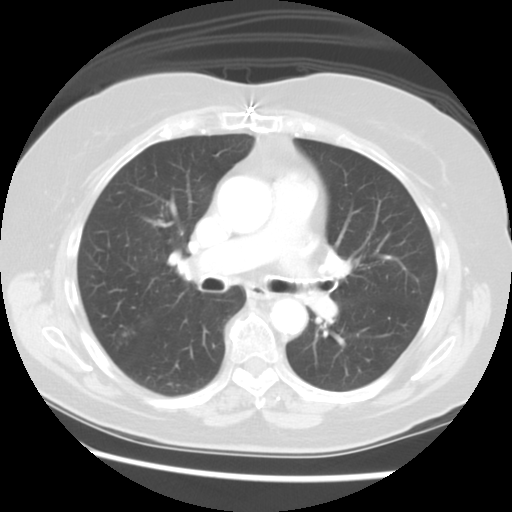
[im 40/64  lung]
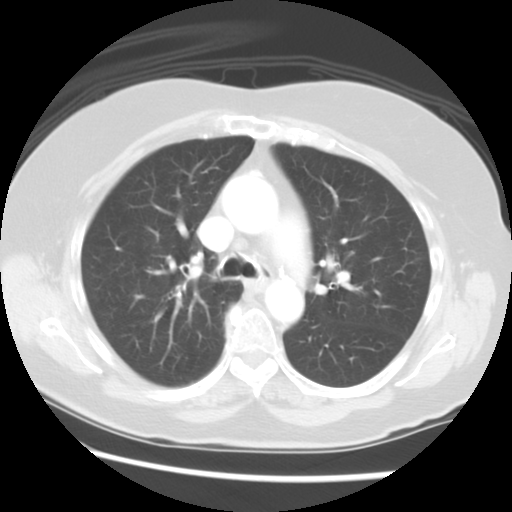
[im 45/64  mediastinal]
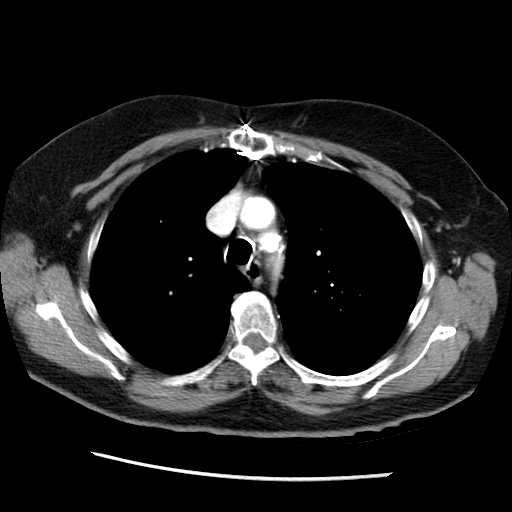
[im 45/64  lung]
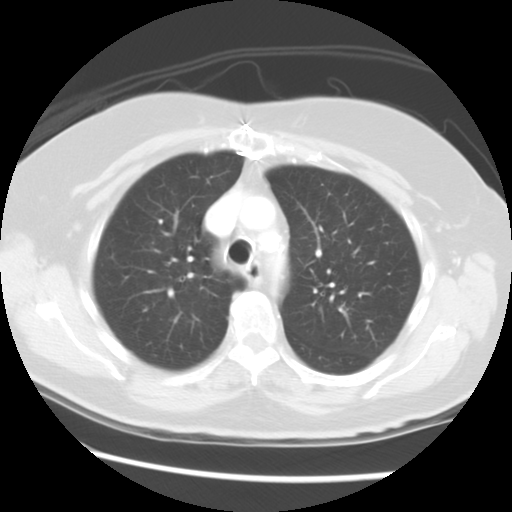
[im 50/64  lung]
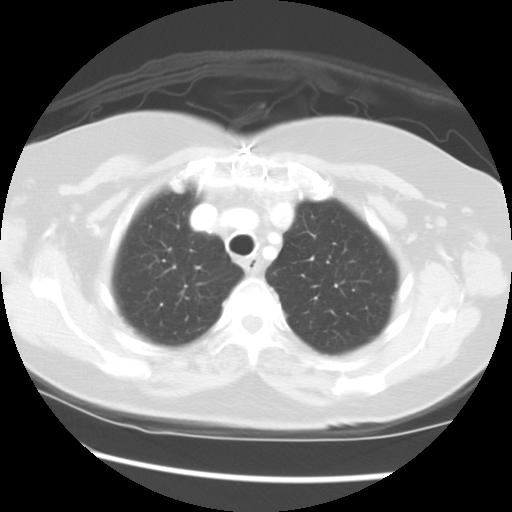
[im 54/64  lung]
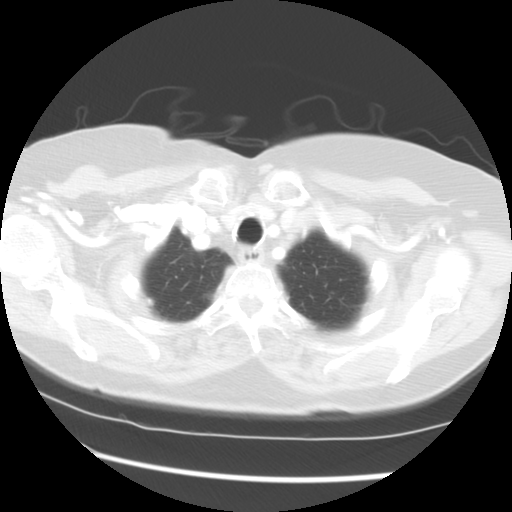
[im 59/64  lung]
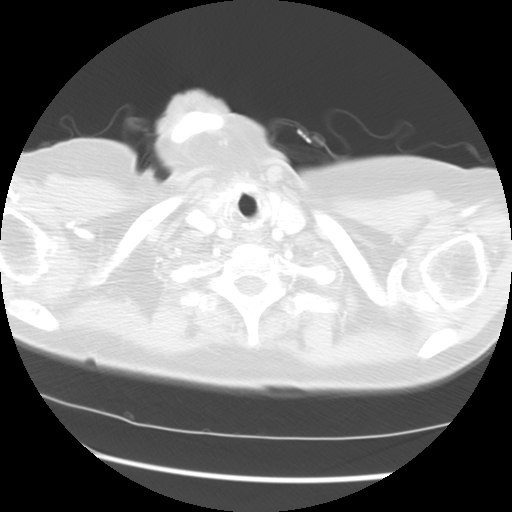

[Series 400: cor · coronal · 0.62mm/px · 3 of 122 slices shown]
[im 25/122  lung]
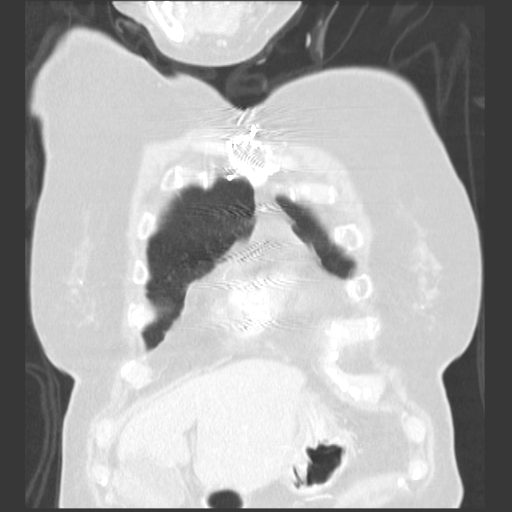
[im 49/122  lung]
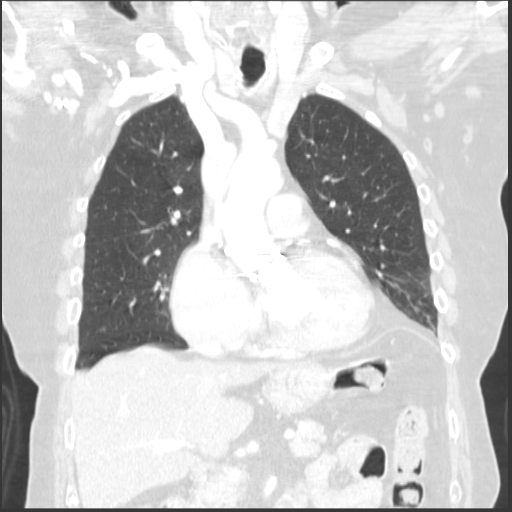
[im 73/122  lung]
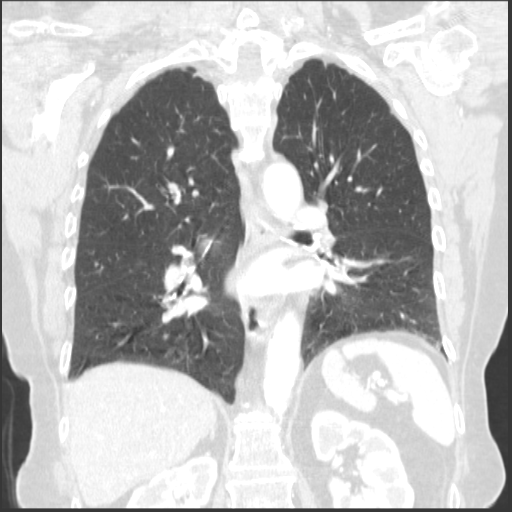

[15 of 36 positions shown; findings below may reference images not displayed]

FINDINGS: Sagittal images of the spine shows osteopenia and degenerative
changes thoracic spine. The patient is status post median
sternotomy.

Central airways are patent. Sagittal images of the thoracic inlet
are unremarkable. Atherosclerotic calcifications of aortic wall are
noted. There is aortic valve prosthesis.

There is elevation of the left hemidiaphragm. Prominent fat pad is
noted in left cardiophrenic angle. There is small amount of
atelectasis or residual infiltrate in left base anteriorly. No
pulmonary mass is noted. Small hiatal hernia. A hepatic cyst in
right hepatic lobe measures 2.2 cm. Cyst in left hepatic lobe
measures 8 mm. No adrenal gland mass is noted in visualized upper
abdomen.

No mediastinal hematoma or adenopathy. Atherosclerotic
calcifications of coronary arteries. No hilar adenopathy. There is
no pulmonary edema. No segmental infiltrates. No pneumothorax. No
pleural effusion. Heart size within normal limits.
IMPRESSION: 1. Mild elevation of the left hemidiaphragm. Prominent fat pad in
left cardiophrenic angle. No definite lung mass noted. Small amount
of atelectasis or residual infiltrate left base anterolaterally.
2. No segmental infiltrate or pulmonary edema.
3. No adenopathy.
4. Status post median sternotomy and aortic valve replacement.

## 2015-11-22 DIAGNOSIS — I10 Essential (primary) hypertension: Secondary | ICD-10-CM | POA: Diagnosis not present

## 2015-11-22 DIAGNOSIS — Z7901 Long term (current) use of anticoagulants: Secondary | ICD-10-CM | POA: Diagnosis not present

## 2015-11-22 DIAGNOSIS — E7800 Pure hypercholesterolemia, unspecified: Secondary | ICD-10-CM | POA: Diagnosis not present

## 2015-11-30 ENCOUNTER — Other Ambulatory Visit: Payer: Self-pay

## 2015-12-07 ENCOUNTER — Encounter: Payer: Self-pay | Admitting: Anesthesiology

## 2015-12-07 DIAGNOSIS — Z7901 Long term (current) use of anticoagulants: Secondary | ICD-10-CM | POA: Diagnosis not present

## 2015-12-07 LAB — PROTIME-INR

## 2015-12-07 LAB — POCT INR: INR: 2.8

## 2015-12-08 ENCOUNTER — Ambulatory Visit (INDEPENDENT_AMBULATORY_CARE_PROVIDER_SITE_OTHER): Payer: Medicare Other | Admitting: Pharmacist

## 2015-12-08 DIAGNOSIS — Z7901 Long term (current) use of anticoagulants: Secondary | ICD-10-CM

## 2015-12-08 DIAGNOSIS — Z952 Presence of prosthetic heart valve: Secondary | ICD-10-CM

## 2015-12-08 DIAGNOSIS — Z954 Presence of other heart-valve replacement: Secondary | ICD-10-CM

## 2015-12-19 DIAGNOSIS — D2239 Melanocytic nevi of other parts of face: Secondary | ICD-10-CM | POA: Diagnosis not present

## 2015-12-19 DIAGNOSIS — D225 Melanocytic nevi of trunk: Secondary | ICD-10-CM | POA: Diagnosis not present

## 2015-12-19 DIAGNOSIS — D045 Carcinoma in situ of skin of trunk: Secondary | ICD-10-CM | POA: Diagnosis not present

## 2016-01-11 ENCOUNTER — Encounter: Payer: Self-pay | Admitting: Anesthesiology

## 2016-01-11 DIAGNOSIS — Z7901 Long term (current) use of anticoagulants: Secondary | ICD-10-CM | POA: Diagnosis not present

## 2016-01-11 LAB — POCT INR: INR: 2.7

## 2016-01-11 LAB — PROTIME-INR

## 2016-01-12 ENCOUNTER — Ambulatory Visit (INDEPENDENT_AMBULATORY_CARE_PROVIDER_SITE_OTHER): Payer: Medicare Other | Admitting: Pharmacist

## 2016-01-12 DIAGNOSIS — Z952 Presence of prosthetic heart valve: Secondary | ICD-10-CM

## 2016-01-19 ENCOUNTER — Other Ambulatory Visit: Payer: Self-pay | Admitting: Cardiovascular Disease

## 2016-01-19 ENCOUNTER — Encounter: Payer: Self-pay | Admitting: Pharmacist Clinician (PhC)/ Clinical Pharmacy Specialist

## 2016-01-19 DIAGNOSIS — Z952 Presence of prosthetic heart valve: Secondary | ICD-10-CM

## 2016-01-19 NOTE — Progress Notes (Signed)
This encounter was created in error - please disregard.

## 2016-02-06 DIAGNOSIS — Z7901 Long term (current) use of anticoagulants: Secondary | ICD-10-CM | POA: Diagnosis not present

## 2016-02-06 LAB — PROTIME-INR: INR: 3 — AB (ref <=1.1)

## 2016-02-07 ENCOUNTER — Ambulatory Visit (INDEPENDENT_AMBULATORY_CARE_PROVIDER_SITE_OTHER): Payer: Medicare Other | Admitting: Pharmacist

## 2016-02-07 DIAGNOSIS — Z952 Presence of prosthetic heart valve: Secondary | ICD-10-CM

## 2016-02-16 ENCOUNTER — Encounter: Payer: Self-pay | Admitting: Cardiovascular Disease

## 2016-02-21 ENCOUNTER — Other Ambulatory Visit: Payer: Self-pay | Admitting: Cardiovascular Disease

## 2016-03-07 DIAGNOSIS — Z7901 Long term (current) use of anticoagulants: Secondary | ICD-10-CM | POA: Diagnosis not present

## 2016-03-07 LAB — PROTIME-INR: INR: 3.4 — AB (ref 0.9–1.1)

## 2016-03-08 ENCOUNTER — Ambulatory Visit (INDEPENDENT_AMBULATORY_CARE_PROVIDER_SITE_OTHER): Payer: Medicare Other | Admitting: Pharmacist Clinician (PhC)/ Clinical Pharmacy Specialist

## 2016-03-08 DIAGNOSIS — Z952 Presence of prosthetic heart valve: Secondary | ICD-10-CM

## 2016-04-09 DIAGNOSIS — Z7901 Long term (current) use of anticoagulants: Secondary | ICD-10-CM | POA: Diagnosis not present

## 2016-04-09 LAB — PROTIME-INR: INR: 3 — AB (ref <=1.1)

## 2016-04-10 ENCOUNTER — Ambulatory Visit: Payer: Self-pay | Admitting: Pharmacist Clinician (PhC)/ Clinical Pharmacy Specialist

## 2016-04-10 DIAGNOSIS — Z952 Presence of prosthetic heart valve: Secondary | ICD-10-CM

## 2016-05-15 DIAGNOSIS — Z7901 Long term (current) use of anticoagulants: Secondary | ICD-10-CM | POA: Diagnosis not present

## 2016-05-15 LAB — PROTIME-INR: INR: 3 — AB (ref 0.9–1.1)

## 2016-05-16 ENCOUNTER — Ambulatory Visit (INDEPENDENT_AMBULATORY_CARE_PROVIDER_SITE_OTHER): Payer: Medicare Other | Admitting: Pharmacist

## 2016-05-16 DIAGNOSIS — Z952 Presence of prosthetic heart valve: Secondary | ICD-10-CM

## 2016-06-13 DIAGNOSIS — Z7901 Long term (current) use of anticoagulants: Secondary | ICD-10-CM | POA: Diagnosis not present

## 2016-06-14 ENCOUNTER — Ambulatory Visit (INDEPENDENT_AMBULATORY_CARE_PROVIDER_SITE_OTHER): Payer: Medicare Other | Admitting: Pharmacist Clinician (PhC)/ Clinical Pharmacy Specialist

## 2016-06-14 DIAGNOSIS — Z952 Presence of prosthetic heart valve: Secondary | ICD-10-CM

## 2016-06-14 LAB — PROTIME-INR: INR: 4 — AB (ref 0.9–1.1)

## 2016-06-17 DIAGNOSIS — J301 Allergic rhinitis due to pollen: Secondary | ICD-10-CM | POA: Diagnosis not present

## 2016-06-26 DIAGNOSIS — E559 Vitamin D deficiency, unspecified: Secondary | ICD-10-CM | POA: Diagnosis not present

## 2016-06-26 DIAGNOSIS — I10 Essential (primary) hypertension: Secondary | ICD-10-CM | POA: Diagnosis not present

## 2016-06-26 DIAGNOSIS — Z0000 Encounter for general adult medical examination without abnormal findings: Secondary | ICD-10-CM | POA: Diagnosis not present

## 2016-06-26 DIAGNOSIS — E7800 Pure hypercholesterolemia, unspecified: Secondary | ICD-10-CM | POA: Diagnosis not present

## 2016-06-28 DIAGNOSIS — Z23 Encounter for immunization: Secondary | ICD-10-CM | POA: Diagnosis not present

## 2016-06-28 DIAGNOSIS — N8110 Cystocele, unspecified: Secondary | ICD-10-CM | POA: Diagnosis not present

## 2016-06-28 DIAGNOSIS — M159 Polyosteoarthritis, unspecified: Secondary | ICD-10-CM | POA: Diagnosis not present

## 2016-06-28 DIAGNOSIS — K5900 Constipation, unspecified: Secondary | ICD-10-CM | POA: Diagnosis not present

## 2016-06-28 DIAGNOSIS — K219 Gastro-esophageal reflux disease without esophagitis: Secondary | ICD-10-CM | POA: Diagnosis not present

## 2016-07-09 DIAGNOSIS — Z7901 Long term (current) use of anticoagulants: Secondary | ICD-10-CM | POA: Diagnosis not present

## 2016-07-09 LAB — PROTIME-INR: INR: 2.6 — AB (ref 0.9–1.1)

## 2016-07-10 ENCOUNTER — Ambulatory Visit (INDEPENDENT_AMBULATORY_CARE_PROVIDER_SITE_OTHER): Payer: Medicare Other | Admitting: Pharmacist

## 2016-07-10 DIAGNOSIS — Z952 Presence of prosthetic heart valve: Secondary | ICD-10-CM

## 2016-08-06 DIAGNOSIS — Z7901 Long term (current) use of anticoagulants: Secondary | ICD-10-CM | POA: Diagnosis not present

## 2016-08-06 LAB — PROTIME-INR: INR: 2.3 — AB (ref 0.9–1.1)

## 2016-08-07 ENCOUNTER — Ambulatory Visit (INDEPENDENT_AMBULATORY_CARE_PROVIDER_SITE_OTHER): Payer: Medicare Other | Admitting: Pharmacist

## 2016-08-07 DIAGNOSIS — Z952 Presence of prosthetic heart valve: Secondary | ICD-10-CM

## 2016-08-07 DIAGNOSIS — Z7901 Long term (current) use of anticoagulants: Secondary | ICD-10-CM

## 2016-09-12 ENCOUNTER — Ambulatory Visit (INDEPENDENT_AMBULATORY_CARE_PROVIDER_SITE_OTHER): Payer: Medicare Other | Admitting: Pharmacist

## 2016-09-12 ENCOUNTER — Encounter: Payer: Self-pay | Admitting: Pharmacist

## 2016-09-12 DIAGNOSIS — Z952 Presence of prosthetic heart valve: Secondary | ICD-10-CM

## 2016-09-12 DIAGNOSIS — Z7901 Long term (current) use of anticoagulants: Secondary | ICD-10-CM | POA: Diagnosis not present

## 2016-09-12 LAB — PROTIME-INR: INR: 2.6 — AB (ref 0.9–1.1)

## 2016-09-12 NOTE — Telephone Encounter (Signed)
This encounter was created in error - please disregard.

## 2016-09-13 ENCOUNTER — Telehealth: Payer: Self-pay | Admitting: Cardiovascular Disease

## 2016-09-13 NOTE — Telephone Encounter (Signed)
Spoke to patient see anticoag note from 09/11/16 for details.

## 2016-09-13 NOTE — Telephone Encounter (Signed)
New message     Her phone is working now and she is getting ready to leave home, she was calling to check on her coumadin level

## 2016-10-10 ENCOUNTER — Ambulatory Visit (INDEPENDENT_AMBULATORY_CARE_PROVIDER_SITE_OTHER): Payer: Medicare Other | Admitting: Pharmacist

## 2016-10-10 DIAGNOSIS — Z952 Presence of prosthetic heart valve: Secondary | ICD-10-CM

## 2016-10-10 DIAGNOSIS — Z7901 Long term (current) use of anticoagulants: Secondary | ICD-10-CM

## 2016-10-10 LAB — PROTIME-INR: INR: 3.1 — AB (ref 0.9–1.1)

## 2016-10-21 DIAGNOSIS — J0100 Acute maxillary sinusitis, unspecified: Secondary | ICD-10-CM | POA: Diagnosis not present

## 2016-10-30 ENCOUNTER — Ambulatory Visit (INDEPENDENT_AMBULATORY_CARE_PROVIDER_SITE_OTHER): Payer: Medicare Other | Admitting: Pharmacist

## 2016-10-30 ENCOUNTER — Ambulatory Visit (INDEPENDENT_AMBULATORY_CARE_PROVIDER_SITE_OTHER): Payer: Medicare Other | Admitting: Cardiovascular Disease

## 2016-10-30 ENCOUNTER — Encounter: Payer: Self-pay | Admitting: Cardiovascular Disease

## 2016-10-30 VITALS — BP 150/62 | HR 72 | Ht 62.0 in | Wt 126.0 lb

## 2016-10-30 DIAGNOSIS — Z7901 Long term (current) use of anticoagulants: Secondary | ICD-10-CM

## 2016-10-30 DIAGNOSIS — I10 Essential (primary) hypertension: Secondary | ICD-10-CM | POA: Diagnosis not present

## 2016-10-30 DIAGNOSIS — Z952 Presence of prosthetic heart valve: Secondary | ICD-10-CM

## 2016-10-30 DIAGNOSIS — E7800 Pure hypercholesterolemia, unspecified: Secondary | ICD-10-CM

## 2016-10-30 LAB — PROTIME-INR: INR: 3 — AB (ref <=1.1)

## 2016-10-30 NOTE — Assessment & Plan Note (Signed)
History of essential hypertension blood pressure measured 150/62. She is on amlodipine, hydrochlorothiazide and metoprolol. Continue current meds at current dosing

## 2016-10-30 NOTE — Patient Instructions (Signed)
Medication Instructions: Your physician recommends that you continue on your current medications as directed. Please refer to the Current Medication list given to you today.  Testing/Procedures: Your physician has requested that you have an echocardiogram. Echocardiography is a painless test that uses sound waves to create images of your heart. It provides your doctor with information about the size and shape of your heart and how well your heart's chambers and valves are working. This procedure takes approximately one hour. There are no restrictions for this procedure.  Follow-Up: Your physician wants you to follow-up in: 1 year with Dr. Berry. You will receive a reminder letter in the mail two months in advance. If you don't receive a letter, please call our office to schedule the follow-up appointment.  If you need a refill on your cardiac medications before your next appointment, please call your pharmacy.  

## 2016-10-30 NOTE — Assessment & Plan Note (Signed)
History of hyperlipidemia on statin therapy followed by her PCP. 

## 2016-10-30 NOTE — Assessment & Plan Note (Signed)
History of St. Jude AVR by Dr. Lilly Cove in October 1998 for bicuspid aortic stenosis. She is on chronic Coumadin anticoagulation. We follow her INRs closely in the office as well as her 2-D echoes.

## 2016-10-30 NOTE — Progress Notes (Signed)
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 I last saw her 10/25/15.[name] She has known valvular heart disease status post aortic valve replacement with a St. Jude mechanical valve by Dr. Lilly Cove October 1998 for what sounds like bicuspid aortic stenosis. She is on chronic Coumadin anticoagulation. Her other problems include hypertension and hyperlipidemia. She is aware of SBE prophylaxis. She denies chest pain or shortness of breath. Her last echo performed 11/15/15, showed a well-functioning aortic mechanical prosthesis.Dr. Shelia Media follows her lipid profile closely. Since I saw her one year ago she's remained clinically stable and specifically denies chest pain, shortness of breath or syncope.   Current Meds  Medication Sig  . amLODipine (NORVASC) 2.5 MG tablet Take 2.5 mg by mouth daily.  . Ascorbic Acid (VITAMIN C) 100 MG tablet Take 100 mg by mouth daily.  [name] atorvastatin (LIPITOR) 10 MG tablet Take 10 mg by mouth every other day.  . B Complex Vitamins (B COMPLEX 100 PO) Take 100 mg by mouth daily.  . Calcium Carbonate-Vitamin D 600-200 MG-UNIT TABS Take 1 tablet by mouth daily.  . hydrochlorothiazide (HYDRODIURIL) 25 MG tablet Take 25 mg by mouth daily.  . metoprolol (LOPRESSOR) 50 MG tablet Take 25 mg by mouth daily.  . vitamin E 400 UNIT capsule Take 400 Units by mouth daily.  [name] warfarin (COUMADIN) 2.5 MG tablet TAKE 1 TO 1.5 TABLET BY MOUTH DAILY OR AS DIRECTED     No Known Allergies  Social History   Social History  . Marital status: Married    Spouse name: N/A  . Number of children: N/A  . Years of education: N/A   Occupational History  . Not on file.   Social History Main Topics  .  Smoking status: Never Smoker  . Smokeless tobacco: Never Used  . Alcohol use Not on file  . Drug use: Unknown  . Sexual activity: Not on file   Other Topics Concern  . Not on file   Social History Narrative  . No narrative on file     Review of Systems: General: negative for chills, fever, night sweats or weight changes.  Cardiovascular: negative for chest pain, dyspnea on exertion, edema, orthopnea, palpitations, paroxysmal nocturnal dyspnea or shortness of breath Dermatological: negative for rash Respiratory: negative for cough or wheezing Urologic: negative for hematuria Abdominal: negative for nausea, vomiting, diarrhea, bright red blood per rectum, melena, or hematemesis Neurologic: negative for visual changes, syncope, or dizziness All other systems reviewed and are otherwise negative except as noted above.    Blood pressure (!) 150/62, pulse 72, height 5\' 2"  (1.575 m), weight 126 lb (57.2 kg).  General appearance: alert and no distress Neck: no adenopathy, no carotid bruit, no JVD, supple, symmetrical, trachea midline and thyroid not enlarged, symmetric, no tenderness/mass/nodules Lungs: clear to auscultation bilaterally Heart: Crisp prosthetic valve sounds Extremities: extremities normal, atraumatic, no cyanosis or edema  EKG normal sinus rhythm at 72 with anterolateral T-wave inversion and evidence of LVH. This may represent repolarization changes. I personally reviewed this EKG.  ASSESSMENT AND PLAN:   H/O aortic valve replacement History of St. Jude AVR by Dr. Lilly Cove in October 1998 for bicuspid aortic stenosis. She is on chronic  Coumadin anticoagulation. We follow her INRs closely in the office as well as her 2-D echoes.  Essential hypertension History of essential hypertension blood pressure measured 150/62. She is on amlodipine, hydrochlorothiazide and metoprolol. Continue current meds at current dosing  Hyperlipidemia History of hyperlipidemia on statin  therapy followed by her PCP      Lorretta Harp MD [facility], [facility] 10/30/2016 8:02 AM

## 2016-11-05 ENCOUNTER — Other Ambulatory Visit: Payer: Self-pay | Admitting: Cardiovascular Disease

## 2016-11-06 DIAGNOSIS — Z7901 Long term (current) use of anticoagulants: Secondary | ICD-10-CM | POA: Diagnosis not present

## 2016-11-06 DIAGNOSIS — I2510 Atherosclerotic heart disease of native coronary artery without angina pectoris: Secondary | ICD-10-CM | POA: Diagnosis not present

## 2016-11-19 ENCOUNTER — Ambulatory Visit (HOSPITAL_COMMUNITY): Payer: Medicare Other | Attending: Cardiovascular Disease

## 2016-11-19 ENCOUNTER — Other Ambulatory Visit: Payer: Self-pay

## 2016-11-19 DIAGNOSIS — E785 Hyperlipidemia, unspecified: Secondary | ICD-10-CM | POA: Diagnosis not present

## 2016-11-19 DIAGNOSIS — I119 Hypertensive heart disease without heart failure: Secondary | ICD-10-CM | POA: Insufficient documentation

## 2016-11-19 DIAGNOSIS — Z9889 Other specified postprocedural states: Secondary | ICD-10-CM | POA: Diagnosis not present

## 2016-11-19 DIAGNOSIS — Z952 Presence of prosthetic heart valve: Secondary | ICD-10-CM | POA: Insufficient documentation

## 2016-11-29 ENCOUNTER — Telehealth: Payer: Self-pay | Admitting: Pharmacist Clinician (PhC)/ Clinical Pharmacy Specialist

## 2016-11-29 NOTE — Telephone Encounter (Signed)
Coumadin letter 

## 2016-12-04 DIAGNOSIS — Z7901 Long term (current) use of anticoagulants: Secondary | ICD-10-CM | POA: Diagnosis not present

## 2016-12-04 LAB — PROTIME-INR: INR: 3.3 — AB (ref 0.9–1.1)

## 2016-12-06 ENCOUNTER — Ambulatory Visit: Payer: Self-pay | Admitting: Pharmacist

## 2016-12-06 DIAGNOSIS — Z952 Presence of prosthetic heart valve: Secondary | ICD-10-CM

## 2016-12-06 DIAGNOSIS — Z7901 Long term (current) use of anticoagulants: Secondary | ICD-10-CM

## 2016-12-27 ENCOUNTER — Telehealth: Payer: Self-pay | Admitting: Cardiovascular Disease

## 2016-12-27 NOTE — Telephone Encounter (Signed)
New message    Pt is calling about an xray she had at her dentist office. It showed something on her carotid. Please call.

## 2016-12-27 NOTE — Telephone Encounter (Signed)
How would a dentist know this? I'm not worried about it but if she is we could get a carotid doppler

## 2016-12-27 NOTE — Telephone Encounter (Signed)
S/w pt she states that she went to the dentist today Dr Guilford Shi DDS [facility] and was told to inform us that she had some plaque in her cartoid.

## 2016-12-29 DIAGNOSIS — J0100 Acute maxillary sinusitis, unspecified: Secondary | ICD-10-CM | POA: Diagnosis not present

## 2017-01-01 NOTE — Telephone Encounter (Signed)
Reviewed Dr. Gwenlyn Found note and explained to patient that he is not concerned about the possible plaque seen on dental xray.  Patient confident that if Dr. Gwenlyn Found not worried, she should not be either.   Explained that we could get a carotid doppler if she felt so inclined, but patient states she will be fine.

## 2017-01-03 ENCOUNTER — Telehealth: Payer: Self-pay | Admitting: Cardiovascular Disease

## 2017-01-03 NOTE — Telephone Encounter (Signed)
Called patient with MD advice, as previously communicated by [facility]. She states she was waiting on MD opinion and I reiterated that his opinion was what [name] had informed her of on 10/2. She will think about carotid doppler and discuss with family and call back if she wishes to pursue further testing

## 2017-01-03 NOTE — Telephone Encounter (Signed)
Alvstad, Kristin L, RPH-CPP      1:20 PM  Note    Reviewed Dr. Gwenlyn Found note and explained to patient that he is not concerned about the possible plaque seen on dental xray.  Patient confident that if Dr. Gwenlyn Found not worried, she should not be either.   Explained that we could get a carotid doppler if she felt so inclined, but patient states she will be fine.           1:20 PM    Rockne Menghini, RPH-CPP contacted [name]  December 27, 2016  Lorretta Harp, MD  to Therisa Doyne     6:35 PM  Note    How would a dentist know this? I'm not worried about it but if she is we could get a carotid doppler

## 2017-01-03 NOTE — Telephone Encounter (Signed)
Pt says she was calling to see what Dr Gwenlyn Found thought about what was seen on xray by the dentist.She said she called on 12-27-16.

## 2017-01-07 DIAGNOSIS — S90861A Insect bite (nonvenomous), right foot, initial encounter: Secondary | ICD-10-CM | POA: Diagnosis not present

## 2017-01-07 DIAGNOSIS — Z7901 Long term (current) use of anticoagulants: Secondary | ICD-10-CM | POA: Diagnosis not present

## 2017-01-07 LAB — PROTIME-INR: INR: 3.2 — AB (ref 0.9–1.1)

## 2017-01-09 ENCOUNTER — Ambulatory Visit (INDEPENDENT_AMBULATORY_CARE_PROVIDER_SITE_OTHER): Payer: Medicare Other | Admitting: Pharmacist Clinician (PhC)/ Clinical Pharmacy Specialist

## 2017-01-09 DIAGNOSIS — Z7901 Long term (current) use of anticoagulants: Secondary | ICD-10-CM

## 2017-01-09 DIAGNOSIS — Z952 Presence of prosthetic heart valve: Secondary | ICD-10-CM | POA: Diagnosis not present

## 2017-02-06 ENCOUNTER — Ambulatory Visit (INDEPENDENT_AMBULATORY_CARE_PROVIDER_SITE_OTHER): Payer: Medicare Other | Admitting: Pharmacist

## 2017-02-06 DIAGNOSIS — Z7901 Long term (current) use of anticoagulants: Secondary | ICD-10-CM | POA: Diagnosis not present

## 2017-02-06 DIAGNOSIS — Z952 Presence of prosthetic heart valve: Secondary | ICD-10-CM | POA: Diagnosis not present

## 2017-02-06 LAB — PROTIME-INR: INR: 3.2 — AB (ref 0.9–1.1)

## 2017-02-11 DIAGNOSIS — J069 Acute upper respiratory infection, unspecified: Secondary | ICD-10-CM | POA: Diagnosis not present

## 2017-03-07 DIAGNOSIS — Z23 Encounter for immunization: Secondary | ICD-10-CM | POA: Diagnosis not present

## 2017-03-14 ENCOUNTER — Ambulatory Visit (INDEPENDENT_AMBULATORY_CARE_PROVIDER_SITE_OTHER): Payer: Medicare Other | Admitting: Pharmacist Clinician (PhC)/ Clinical Pharmacy Specialist

## 2017-03-14 DIAGNOSIS — Z952 Presence of prosthetic heart valve: Secondary | ICD-10-CM

## 2017-03-14 DIAGNOSIS — Z7901 Long term (current) use of anticoagulants: Secondary | ICD-10-CM | POA: Diagnosis not present

## 2017-03-14 LAB — PROTIME-INR: INR: 3.2 — AB (ref 0.9–1.1)

## 2017-04-10 ENCOUNTER — Ambulatory Visit (INDEPENDENT_AMBULATORY_CARE_PROVIDER_SITE_OTHER): Payer: Medicare Other | Admitting: Pharmacist Clinician (PhC)/ Clinical Pharmacy Specialist

## 2017-04-10 DIAGNOSIS — Z952 Presence of prosthetic heart valve: Secondary | ICD-10-CM

## 2017-04-10 DIAGNOSIS — Z7901 Long term (current) use of anticoagulants: Secondary | ICD-10-CM | POA: Diagnosis not present

## 2017-04-10 LAB — PROTIME-INR: INR: 3 — AB (ref 0.9–1.1)

## 2017-05-14 ENCOUNTER — Ambulatory Visit (INDEPENDENT_AMBULATORY_CARE_PROVIDER_SITE_OTHER): Payer: Medicare Other | Admitting: Pharmacist

## 2017-05-14 DIAGNOSIS — Z952 Presence of prosthetic heart valve: Secondary | ICD-10-CM

## 2017-05-14 DIAGNOSIS — Z7901 Long term (current) use of anticoagulants: Secondary | ICD-10-CM

## 2017-05-14 LAB — PROTIME-INR: INR: 2.8 — AB (ref 0.9–1.1)

## 2017-06-10 ENCOUNTER — Ambulatory Visit (INDEPENDENT_AMBULATORY_CARE_PROVIDER_SITE_OTHER): Payer: Medicare Other | Admitting: Pharmacist

## 2017-06-10 DIAGNOSIS — Z952 Presence of prosthetic heart valve: Secondary | ICD-10-CM

## 2017-06-10 DIAGNOSIS — Z7901 Long term (current) use of anticoagulants: Secondary | ICD-10-CM

## 2017-06-10 LAB — PROTIME-INR: INR: 2.8 — AB (ref 0.9–1.1)

## 2017-06-19 DIAGNOSIS — J309 Allergic rhinitis, unspecified: Secondary | ICD-10-CM | POA: Diagnosis not present

## 2017-06-19 DIAGNOSIS — R509 Fever, unspecified: Secondary | ICD-10-CM | POA: Diagnosis not present

## 2017-06-19 DIAGNOSIS — J019 Acute sinusitis, unspecified: Secondary | ICD-10-CM | POA: Diagnosis not present

## 2017-07-02 DIAGNOSIS — I10 Essential (primary) hypertension: Secondary | ICD-10-CM | POA: Diagnosis not present

## 2017-07-02 DIAGNOSIS — E559 Vitamin D deficiency, unspecified: Secondary | ICD-10-CM | POA: Diagnosis not present

## 2017-07-02 DIAGNOSIS — N390 Urinary tract infection, site not specified: Secondary | ICD-10-CM | POA: Diagnosis not present

## 2017-07-02 DIAGNOSIS — M810 Age-related osteoporosis without current pathological fracture: Secondary | ICD-10-CM | POA: Diagnosis not present

## 2017-07-09 DIAGNOSIS — E7800 Pure hypercholesterolemia, unspecified: Secondary | ICD-10-CM | POA: Diagnosis not present

## 2017-07-09 DIAGNOSIS — M159 Polyosteoarthritis, unspecified: Secondary | ICD-10-CM | POA: Diagnosis not present

## 2017-07-09 DIAGNOSIS — I10 Essential (primary) hypertension: Secondary | ICD-10-CM | POA: Diagnosis not present

## 2017-07-09 DIAGNOSIS — Z952 Presence of prosthetic heart valve: Secondary | ICD-10-CM | POA: Diagnosis not present

## 2017-07-09 DIAGNOSIS — Z0001 Encounter for general adult medical examination with abnormal findings: Secondary | ICD-10-CM | POA: Diagnosis not present

## 2017-07-09 DIAGNOSIS — I2510 Atherosclerotic heart disease of native coronary artery without angina pectoris: Secondary | ICD-10-CM | POA: Diagnosis not present

## 2017-07-09 DIAGNOSIS — Z7901 Long term (current) use of anticoagulants: Secondary | ICD-10-CM | POA: Diagnosis not present

## 2017-07-09 DIAGNOSIS — M8580 Other specified disorders of bone density and structure, unspecified site: Secondary | ICD-10-CM | POA: Diagnosis not present

## 2017-07-09 DIAGNOSIS — M19041 Primary osteoarthritis, right hand: Secondary | ICD-10-CM | POA: Diagnosis not present

## 2017-07-09 DIAGNOSIS — M19042 Primary osteoarthritis, left hand: Secondary | ICD-10-CM | POA: Diagnosis not present

## 2017-07-09 DIAGNOSIS — I6529 Occlusion and stenosis of unspecified carotid artery: Secondary | ICD-10-CM | POA: Diagnosis not present

## 2017-07-09 DIAGNOSIS — K219 Gastro-esophageal reflux disease without esophagitis: Secondary | ICD-10-CM | POA: Diagnosis not present

## 2017-07-12 ENCOUNTER — Other Ambulatory Visit: Payer: Self-pay | Admitting: Cardiovascular Disease

## 2017-07-12 MED ORDER — WARFARIN SODIUM 2.5 MG PO TABS: ORAL_TABLET | ORAL | 1 refills | Status: DC

## 2017-07-12 NOTE — Telephone Encounter (Signed)
°*  STAT* If patient is at the pharmacy, call can be transferred to refill team.   1. Which medications need to be refilled? (please list name of each medication and dose if known) Warfarin-need this today please  2. Which pharmacy/location (including street and city if local pharmacy) is medication to be sent to?[facility]-[phone] 3. Do they need a 30 day or 90 day supply? 120 and refills

## 2017-07-12 NOTE — Telephone Encounter (Signed)
Rx sent to prefer pharmacy and patient informed

## 2017-07-15 DIAGNOSIS — Z7901 Long term (current) use of anticoagulants: Secondary | ICD-10-CM | POA: Diagnosis not present

## 2017-07-15 LAB — PROTIME-INR: INR: 4.1 — AB (ref 0.9–1.1)

## 2017-07-16 ENCOUNTER — Ambulatory Visit (INDEPENDENT_AMBULATORY_CARE_PROVIDER_SITE_OTHER): Payer: Medicare Other | Admitting: Pharmacist

## 2017-07-16 DIAGNOSIS — Z7901 Long term (current) use of anticoagulants: Secondary | ICD-10-CM

## 2017-07-16 DIAGNOSIS — Z952 Presence of prosthetic heart valve: Secondary | ICD-10-CM | POA: Diagnosis not present

## 2017-07-16 MED ORDER — WARFARIN SODIUM 2.5 MG PO TABS: ORAL_TABLET | ORAL | 1 refills | Status: DC

## 2017-07-18 DIAGNOSIS — I2510 Atherosclerotic heart disease of native coronary artery without angina pectoris: Secondary | ICD-10-CM | POA: Diagnosis not present

## 2017-07-18 DIAGNOSIS — I6523 Occlusion and stenosis of bilateral carotid arteries: Secondary | ICD-10-CM | POA: Diagnosis not present

## 2017-07-29 DIAGNOSIS — Z7901 Long term (current) use of anticoagulants: Secondary | ICD-10-CM | POA: Diagnosis not present

## 2017-07-29 LAB — PROTIME-INR: INR: 3.2 — AB (ref 0.9–1.1)

## 2017-07-30 ENCOUNTER — Ambulatory Visit (INDEPENDENT_AMBULATORY_CARE_PROVIDER_SITE_OTHER): Payer: Medicare Other | Admitting: Pharmacist

## 2017-07-30 DIAGNOSIS — Z7901 Long term (current) use of anticoagulants: Secondary | ICD-10-CM

## 2017-07-30 DIAGNOSIS — Z952 Presence of prosthetic heart valve: Secondary | ICD-10-CM

## 2017-08-24 DIAGNOSIS — M79606 Pain in leg, unspecified: Secondary | ICD-10-CM | POA: Diagnosis not present

## 2017-08-24 DIAGNOSIS — H6123 Impacted cerumen, bilateral: Secondary | ICD-10-CM | POA: Diagnosis not present

## 2017-08-28 DIAGNOSIS — Z7901 Long term (current) use of anticoagulants: Secondary | ICD-10-CM | POA: Diagnosis not present

## 2017-08-28 LAB — PROTIME-INR: INR: 3.5 — AB (ref 0.9–1.1)

## 2017-08-29 ENCOUNTER — Ambulatory Visit (INDEPENDENT_AMBULATORY_CARE_PROVIDER_SITE_OTHER): Payer: Medicare Other | Admitting: Pharmacist

## 2017-08-29 DIAGNOSIS — Z7901 Long term (current) use of anticoagulants: Secondary | ICD-10-CM | POA: Diagnosis not present

## 2017-08-29 DIAGNOSIS — Z952 Presence of prosthetic heart valve: Secondary | ICD-10-CM

## 2017-10-01 ENCOUNTER — Ambulatory Visit (INDEPENDENT_AMBULATORY_CARE_PROVIDER_SITE_OTHER): Payer: Medicare Other | Admitting: Pharmacist

## 2017-10-01 ENCOUNTER — Telehealth: Payer: Self-pay | Admitting: Cardiovascular Disease

## 2017-10-01 DIAGNOSIS — Z952 Presence of prosthetic heart valve: Secondary | ICD-10-CM

## 2017-10-01 DIAGNOSIS — Z7901 Long term (current) use of anticoagulants: Secondary | ICD-10-CM

## 2017-10-01 LAB — PROTIME-INR
INR: 4.5 — ABNORMAL HIGH (ref 0.8–1.2)
Prothrombin Time: 45.6 s — ABNORMAL HIGH (ref 9.1–12.0)

## 2017-10-01 NOTE — Telephone Encounter (Signed)
Follow up    [facility] calling back to request order be faxed to [phone]. Patient waiting.

## 2017-10-01 NOTE — Telephone Encounter (Signed)
° ° °  Lattie Haw from [facility] calling to request order for Julie Mcmahon - patient there now  Fax [phone] : Attn: Lattie Haw Phone [phone]

## 2017-10-01 NOTE — Telephone Encounter (Signed)
Lab ordered faxed

## 2017-10-16 ENCOUNTER — Ambulatory Visit (INDEPENDENT_AMBULATORY_CARE_PROVIDER_SITE_OTHER): Payer: Medicare Other | Admitting: Pharmacist

## 2017-10-16 DIAGNOSIS — Z7901 Long term (current) use of anticoagulants: Secondary | ICD-10-CM | POA: Diagnosis not present

## 2017-10-16 DIAGNOSIS — Z952 Presence of prosthetic heart valve: Secondary | ICD-10-CM

## 2017-10-16 LAB — PROTIME-INR
INR: 4.3 — ABNORMAL HIGH (ref 0.8–1.2)
Prothrombin Time: 44.3 s — ABNORMAL HIGH (ref 9.1–12.0)

## 2017-10-25 DIAGNOSIS — R05 Cough: Secondary | ICD-10-CM | POA: Diagnosis not present

## 2017-10-25 DIAGNOSIS — J069 Acute upper respiratory infection, unspecified: Secondary | ICD-10-CM | POA: Diagnosis not present

## 2017-10-30 ENCOUNTER — Ambulatory Visit (INDEPENDENT_AMBULATORY_CARE_PROVIDER_SITE_OTHER): Payer: Medicare Other | Admitting: Pharmacist Clinician (PhC)/ Clinical Pharmacy Specialist

## 2017-10-30 ENCOUNTER — Encounter: Payer: Self-pay | Admitting: Cardiovascular Disease

## 2017-10-30 ENCOUNTER — Ambulatory Visit (INDEPENDENT_AMBULATORY_CARE_PROVIDER_SITE_OTHER): Payer: Medicare Other | Admitting: Cardiovascular Disease

## 2017-10-30 DIAGNOSIS — Z952 Presence of prosthetic heart valve: Secondary | ICD-10-CM

## 2017-10-30 DIAGNOSIS — Z7901 Long term (current) use of anticoagulants: Secondary | ICD-10-CM

## 2017-10-30 LAB — POCT INR: INR: 2.8 (ref 2.0–3.0)

## 2017-10-30 NOTE — Progress Notes (Signed)
10/30/2017 [name]   06-19-1927  [id number]  Primary Physician Deland Pretty, MD Primary Cardiologist: Lorretta Harp MD [name], [place]  HPI:  Julie Mcmahon is a 84 y.o.  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 She has known valvular heart disease status post aortic valve replacement with a St. Jude mechanical valve by Dr. Lilly Cove October 1998 for what sounds like bicuspid aortic stenosis. She is on chronic Coumadin anticoagulation. Her other problems include hypertension and hyperlipidemia. She is aware of SBE prophylaxis. She denies chest pain or shortness of breath. Her last echo performed  11/19/2016, showed a well-functioning aortic mechanical prosthesis.Dr. Shelia Media follows her lipid profile closely. Since I saw her one year ago she's remained clinically stable and specifically denies chest pain, shortness of breath or syncope.     Current Meds  Medication Sig  . amLODipine (NORVASC) 2.5 MG tablet Take 2.5 mg by mouth daily.  . Ascorbic Acid (VITAMIN C) 100 MG tablet Take 100 mg by mouth daily.  [name] atorvastatin (LIPITOR) 10 MG tablet Take 10 mg by mouth every other day.  . B Complex Vitamins (B COMPLEX 100 PO) Take 100 mg by mouth daily.  . Calcium Carbonate-Vitamin D 600-200 MG-UNIT TABS Take 1 tablet by mouth daily.  . hydrochlorothiazide (HYDRODIURIL) 25 MG tablet Take 25 mg by mouth daily.  . metoprolol (LOPRESSOR) 50 MG tablet Take 25 mg by mouth daily.  . vitamin E 400 UNIT capsule Take 400 Units by mouth daily.  [name] warfarin (COUMADIN) 2.5 MG tablet Take 1 tablet by mouth daily or as directed by coumadin clinic     No Known Allergies  Social History   Socioeconomic History  . Marital status: Married    Spouse name: Not on file  . Number of children: Not on file  . Years of education: Not on file  . Highest education  level: Not on file  Occupational History  . Not on file  Social Needs  . Financial resource strain: Not on file  . Food insecurity:    Worry: Not on file    Inability: Not on file  . Transportation needs:    Medical: Not on file    Non-medical: Not on file  Tobacco Use  . Smoking status: Never Smoker  . Smokeless tobacco: Never Used  Substance and Sexual Activity  . Alcohol use: Not on file  . Drug use: Not on file  . Sexual activity: Not on file  Lifestyle  . Physical activity:    Days per week: Not on file    Minutes per session: Not on file  . Stress: Not on file  Relationships  . Social connections:    Talks on phone: Not on file    Gets together: Not on file    Attends religious service: Not on file    Active member of club or organization: Not on file    Attends meetings of clubs or organizations: Not on file    Relationship status: Not on file  . Intimate partner violence:    Fear of current or ex partner: Not on file    Emotionally abused: Not on file    Physically abused: Not on file    Forced sexual activity: Not on file  Other Topics Concern  . Not on file  Social History Narrative  .  Not on file     Review of Systems: General: negative for chills, fever, night sweats or weight changes.  Cardiovascular: negative for chest pain, dyspnea on exertion, edema, orthopnea, palpitations, paroxysmal nocturnal dyspnea or shortness of breath Dermatological: negative for rash Respiratory: negative for cough or wheezing Urologic: negative for hematuria Abdominal: negative for nausea, vomiting, diarrhea, bright red blood per rectum, melena, or hematemesis Neurologic: negative for visual changes, syncope, or dizziness All other systems reviewed and are otherwise negative except as noted above.    Blood pressure (!) 174/62, pulse 74, height 5\' 2"  (1.575 m), weight 125 lb 3.2 oz (56.8 kg).  General appearance: alert and no distress Neck: no adenopathy, no carotid  bruit, no JVD, supple, symmetrical, trachea midline and thyroid not enlarged, symmetric, no tenderness/mass/nodules Lungs: clear to auscultation bilaterally Heart: Crisp prosthetic valve sounds Extremities: extremities normal, atraumatic, no cyanosis or edema Pulses: 2+ and symmetric Skin: Skin color, texture, turgor normal. No rashes or lesions Neurologic: Alert and oriented X 3, normal strength and tone. Normal symmetric reflexes. Normal coordination and gait  EKG sinus rhythm at 74 with evidence of LVH with repolarization changes.  I personally reviewed this EKG.  ASSESSMENT AND PLAN:   H/O aortic valve replacement History of bicuspid aortic stenosis status post [facility] Jude AVR by Dr. Roxy Manns October 1998.  We have been checking her 2D echoes annually.  This was last checked 11/19/2016 and was completely normal.  She is asymptomatic.  She does take Coumadin oral anticoagulation followed here in our office.  Essential hypertension History of essential hypertension with blood pressure measured today at 174/62.  She is on metoprolol and hydrochlorothiazide.  She said that she took her blood pressure prior to coming here at home which measured 136/60.  Hyperlipidemia History of hyperlipidemia on statin therapy lipid profile performed 07/02/2017 revealing total cholesterol 163, LDL of 82 and HDL of Blue MD [facility], [facility] 10/30/2017 9:58 AM

## 2017-10-30 NOTE — Patient Instructions (Signed)
Medication Instructions:  Your physician recommends that you continue on your current medications as directed. Please refer to the Current Medication list given to you today.   Labwork: NONE  Testing/Procedures: Your physician has requested that you have an echocardiogram. Echocardiography is a painless test that uses sound waves to create images of your heart. It provides your doctor with information about the size and shape of your heart and how well your heart's chambers and valves are working. This procedure takes approximately one hour. There are no restrictions for this procedure.    Follow-Up: Your physician wants you to follow-up in: [place]. You will receive a reminder letter in the mail two months in advance. If you don't receive a letter, please call our office to schedule the follow-up appointment.   Any Other Special Instructions Will Be Listed Below (If Applicable).     If you need a refill on your cardiac medications before your next appointment, please call your pharmacy.

## 2017-10-30 NOTE — Assessment & Plan Note (Signed)
History of hyperlipidemia on statin therapy lipid profile performed 07/02/2017 revealing total cholesterol 163, LDL of 82 and HDL of 65

## 2017-10-30 NOTE — Assessment & Plan Note (Signed)
History of essential hypertension with blood pressure measured today at 174/62.  She is on metoprolol and hydrochlorothiazide.  She said that she took her blood pressure prior to coming here at home which measured 136/60.

## 2017-10-30 NOTE — Assessment & Plan Note (Signed)
History of bicuspid aortic stenosis status post [facility] Jude AVR by Dr. Roxy Manns October 1998.  We have been checking her 2D echoes annually.  This was last checked 11/19/2016 and was completely normal.  She is asymptomatic.  She does take Coumadin oral anticoagulation followed here in our office.

## 2017-11-26 ENCOUNTER — Ambulatory Visit (HOSPITAL_COMMUNITY): Payer: Medicare Other | Attending: Cardiovascular Disease

## 2017-11-26 ENCOUNTER — Other Ambulatory Visit: Payer: Self-pay

## 2017-11-26 DIAGNOSIS — Z952 Presence of prosthetic heart valve: Secondary | ICD-10-CM

## 2017-11-26 DIAGNOSIS — I071 Rheumatic tricuspid insufficiency: Secondary | ICD-10-CM | POA: Diagnosis not present

## 2017-11-27 ENCOUNTER — Other Ambulatory Visit: Payer: Self-pay | Admitting: *Deleted

## 2017-11-27 DIAGNOSIS — Z952 Presence of prosthetic heart valve: Secondary | ICD-10-CM

## 2017-11-27 NOTE — Progress Notes (Signed)
echo

## 2017-12-03 ENCOUNTER — Telehealth: Payer: Self-pay | Admitting: Pharmacist

## 2017-12-03 ENCOUNTER — Ambulatory Visit (INDEPENDENT_AMBULATORY_CARE_PROVIDER_SITE_OTHER): Payer: Medicare Other | Admitting: Pharmacist Clinician (PhC)/ Clinical Pharmacy Specialist

## 2017-12-03 DIAGNOSIS — Z7901 Long term (current) use of anticoagulants: Secondary | ICD-10-CM

## 2017-12-03 DIAGNOSIS — Z952 Presence of prosthetic heart valve: Secondary | ICD-10-CM | POA: Diagnosis not present

## 2017-12-03 LAB — PROTIME-INR
INR: 2.3 — ABNORMAL HIGH (ref 0.8–1.2)
Prothrombin Time: 22.3 s — ABNORMAL HIGH (ref 9.1–12.0)

## 2017-12-03 NOTE — Telephone Encounter (Signed)
Standing order for PT/INR sent to [facility]

## 2017-12-09 DIAGNOSIS — E559 Vitamin D deficiency, unspecified: Secondary | ICD-10-CM | POA: Diagnosis not present

## 2017-12-09 DIAGNOSIS — E786 Lipoprotein deficiency: Secondary | ICD-10-CM | POA: Diagnosis not present

## 2017-12-09 DIAGNOSIS — I10 Essential (primary) hypertension: Secondary | ICD-10-CM | POA: Diagnosis not present

## 2018-01-01 DIAGNOSIS — Z23 Encounter for immunization: Secondary | ICD-10-CM | POA: Diagnosis not present

## 2018-01-07 ENCOUNTER — Other Ambulatory Visit: Payer: Self-pay | Admitting: Cardiovascular Disease

## 2018-01-07 ENCOUNTER — Ambulatory Visit (INDEPENDENT_AMBULATORY_CARE_PROVIDER_SITE_OTHER): Payer: Medicare Other | Admitting: Pharmacist

## 2018-01-07 DIAGNOSIS — Z7901 Long term (current) use of anticoagulants: Secondary | ICD-10-CM | POA: Diagnosis not present

## 2018-01-07 DIAGNOSIS — Z952 Presence of prosthetic heart valve: Secondary | ICD-10-CM

## 2018-01-07 LAB — PROTIME-INR
INR: 3.2 — AB (ref <=1.1)
INR: 3.2 — ABNORMAL HIGH (ref 0.8–1.2)
Prothrombin Time: 30.6 s — ABNORMAL HIGH (ref 9.1–12.0)

## 2018-01-20 DIAGNOSIS — J060 Acute laryngopharyngitis: Secondary | ICD-10-CM | POA: Diagnosis not present

## 2018-02-04 ENCOUNTER — Other Ambulatory Visit: Payer: Self-pay | Admitting: Cardiovascular Disease

## 2018-02-04 DIAGNOSIS — Z7901 Long term (current) use of anticoagulants: Secondary | ICD-10-CM | POA: Diagnosis not present

## 2018-02-04 LAB — PROTIME-INR
INR: 2.5 — ABNORMAL HIGH (ref 0.8–1.2)
Prothrombin Time: 24.7 s — ABNORMAL HIGH (ref 9.1–12.0)

## 2018-02-05 ENCOUNTER — Ambulatory Visit (INDEPENDENT_AMBULATORY_CARE_PROVIDER_SITE_OTHER): Payer: Medicare Other | Admitting: Pharmacist

## 2018-02-05 DIAGNOSIS — Z7901 Long term (current) use of anticoagulants: Secondary | ICD-10-CM

## 2018-02-05 DIAGNOSIS — Z952 Presence of prosthetic heart valve: Secondary | ICD-10-CM

## 2018-03-10 ENCOUNTER — Other Ambulatory Visit: Payer: Self-pay | Admitting: Cardiovascular Disease

## 2018-03-10 DIAGNOSIS — Z7901 Long term (current) use of anticoagulants: Secondary | ICD-10-CM | POA: Diagnosis not present

## 2018-03-10 LAB — POCT INR: INR: 2.8 (ref 2.0–3.0)

## 2018-03-10 LAB — PROTIME-INR
INR: 2.8 — ABNORMAL HIGH (ref 0.8–1.2)
Prothrombin Time: 27.5 s — ABNORMAL HIGH (ref 9.1–12.0)

## 2018-03-11 ENCOUNTER — Ambulatory Visit (INDEPENDENT_AMBULATORY_CARE_PROVIDER_SITE_OTHER): Payer: Medicare Other | Admitting: Cardiovascular Disease

## 2018-03-11 DIAGNOSIS — Z7901 Long term (current) use of anticoagulants: Secondary | ICD-10-CM

## 2018-03-11 DIAGNOSIS — Z952 Presence of prosthetic heart valve: Secondary | ICD-10-CM

## 2018-04-07 ENCOUNTER — Telehealth: Payer: Self-pay | Admitting: Pharmacist Clinician (PhC)/ Clinical Pharmacy Specialist

## 2018-04-07 DIAGNOSIS — Z952 Presence of prosthetic heart valve: Secondary | ICD-10-CM

## 2018-04-07 NOTE — Telephone Encounter (Signed)
Orders for OV PT/INR updated

## 2018-04-15 ENCOUNTER — Ambulatory Visit (INDEPENDENT_AMBULATORY_CARE_PROVIDER_SITE_OTHER): Payer: Medicare Other | Admitting: *Deleted

## 2018-04-15 DIAGNOSIS — Z7901 Long term (current) use of anticoagulants: Secondary | ICD-10-CM

## 2018-04-15 DIAGNOSIS — Z952 Presence of prosthetic heart valve: Secondary | ICD-10-CM | POA: Diagnosis not present

## 2018-04-15 LAB — POCT INR: INR: 2.3 (ref 2.0–3.0)

## 2018-04-15 NOTE — Patient Instructions (Signed)
Description   Continue with 1 tablet daily except 1/2 tablet on Wednesdays.  Repeat INR in 5 weeks. Call us with any new medications or any concerns # [phone].

## 2018-05-07 DIAGNOSIS — N8111 Cystocele, midline: Secondary | ICD-10-CM | POA: Diagnosis not present

## 2018-05-19 DIAGNOSIS — Z466 Encounter for fitting and adjustment of urinary device: Secondary | ICD-10-CM | POA: Diagnosis not present

## 2018-05-20 ENCOUNTER — Ambulatory Visit (INDEPENDENT_AMBULATORY_CARE_PROVIDER_SITE_OTHER): Payer: Medicare Other | Admitting: *Deleted

## 2018-05-20 DIAGNOSIS — Z7901 Long term (current) use of anticoagulants: Secondary | ICD-10-CM

## 2018-05-20 DIAGNOSIS — Z952 Presence of prosthetic heart valve: Secondary | ICD-10-CM

## 2018-05-20 LAB — POCT INR: INR: 3.3 — AB (ref 2.0–3.0)

## 2018-05-20 NOTE — Patient Instructions (Signed)
Description   Today take 1/2 tablet, eat a large serving of leafy green vegetable. Continue with 1 tablet daily except 1/2 tablet on Wednesdays.  Repeat INR in 4 weeks. Call us with any new medications or any concerns # [phone].

## 2018-05-21 DIAGNOSIS — Z4689 Encounter for fitting and adjustment of other specified devices: Secondary | ICD-10-CM | POA: Diagnosis not present

## 2018-05-26 DIAGNOSIS — Z961 Presence of intraocular lens: Secondary | ICD-10-CM | POA: Diagnosis not present

## 2018-05-26 DIAGNOSIS — H04123 Dry eye syndrome of bilateral lacrimal glands: Secondary | ICD-10-CM | POA: Diagnosis not present

## 2018-06-17 ENCOUNTER — Other Ambulatory Visit: Payer: Self-pay

## 2018-06-17 ENCOUNTER — Ambulatory Visit (INDEPENDENT_AMBULATORY_CARE_PROVIDER_SITE_OTHER): Payer: Medicare Other | Admitting: *Deleted

## 2018-06-17 DIAGNOSIS — Z952 Presence of prosthetic heart valve: Secondary | ICD-10-CM

## 2018-06-17 DIAGNOSIS — Z7901 Long term (current) use of anticoagulants: Secondary | ICD-10-CM

## 2018-06-17 LAB — POCT INR: INR: 3.3 — AB (ref 2.0–3.0)

## 2018-06-17 NOTE — Patient Instructions (Signed)
Description   Start taking 1 tablet everyday except 1/2 tablet on Tuesdays and Saturdays.  Repeat INR in 2 weeks. Call us with any new medications or any concerns # [phone].

## 2018-06-30 ENCOUNTER — Telehealth: Payer: Self-pay

## 2018-06-30 ENCOUNTER — Encounter: Payer: Self-pay | Admitting: Cardiovascular Disease

## 2018-06-30 ENCOUNTER — Telehealth: Payer: Self-pay | Admitting: Cardiovascular Disease

## 2018-06-30 NOTE — Telephone Encounter (Signed)
New Message:    Pt wants you to call her please.

## 2018-06-30 NOTE — Telephone Encounter (Signed)

## 2018-06-30 NOTE — Telephone Encounter (Signed)
Patient aware of new time and drive thru clinic in [place]

## 2018-07-01 ENCOUNTER — Ambulatory Visit (INDEPENDENT_AMBULATORY_CARE_PROVIDER_SITE_OTHER): Payer: Medicare Other | Admitting: *Deleted

## 2018-07-01 ENCOUNTER — Other Ambulatory Visit: Payer: Self-pay

## 2018-07-01 DIAGNOSIS — Z952 Presence of prosthetic heart valve: Secondary | ICD-10-CM | POA: Diagnosis not present

## 2018-07-01 DIAGNOSIS — Z7901 Long term (current) use of anticoagulants: Secondary | ICD-10-CM

## 2018-07-01 LAB — POCT INR: INR: 2.6 (ref 2.0–3.0)

## 2018-07-02 ENCOUNTER — Other Ambulatory Visit: Payer: Self-pay | Admitting: Cardiovascular Disease

## 2018-07-02 ENCOUNTER — Telehealth: Payer: Self-pay | Admitting: Cardiovascular Disease

## 2018-07-02 NOTE — Telephone Encounter (Signed)
Prescription clarified.

## 2018-07-02 NOTE — Telephone Encounter (Signed)
   Pt c/o medication issue:  1. Name of Medication: warfarin (COUMADIN) 2.5 MG tablet  2. How are you currently taking this medication (dosage and times per day)? na  3. Are you having a reaction (difficulty breathing--STAT)?  Na  4. What is your medication issue? Pharmacy would like to speak with someone to verify new script is correct

## 2018-07-28 ENCOUNTER — Telehealth: Payer: Self-pay

## 2018-07-28 NOTE — Telephone Encounter (Signed)

## 2018-07-29 ENCOUNTER — Telehealth: Payer: Self-pay | Admitting: Cardiovascular Disease

## 2018-07-29 ENCOUNTER — Other Ambulatory Visit: Payer: Self-pay

## 2018-07-29 ENCOUNTER — Ambulatory Visit (INDEPENDENT_AMBULATORY_CARE_PROVIDER_SITE_OTHER): Payer: Medicare Other | Admitting: *Deleted

## 2018-07-29 DIAGNOSIS — Z952 Presence of prosthetic heart valve: Secondary | ICD-10-CM | POA: Diagnosis not present

## 2018-07-29 DIAGNOSIS — Z7901 Long term (current) use of anticoagulants: Secondary | ICD-10-CM

## 2018-07-29 LAB — POCT INR: INR: 3.4 — AB (ref 2.0–3.0)

## 2018-07-29 NOTE — Telephone Encounter (Signed)
Spoke with patient, she got EOB for recent INR check and is confused.  Will forward info to Angie in billing, who will get back with me.

## 2018-07-29 NOTE — Telephone Encounter (Signed)
  Patient would like to speak with [name] regarding her blood work.

## 2018-07-30 NOTE — Telephone Encounter (Signed)
Explained benefits to patient.  She doesn't owe any money to [facility] for INR checks regardless of if she goes to clinic or lab.  She has gone to lab for many years and would like to go back to that.  She will keep her appointment for May at the [facility] office, then call to have [place] renew her lab order.

## 2018-08-22 ENCOUNTER — Telehealth: Payer: Self-pay

## 2018-08-22 NOTE — Telephone Encounter (Signed)
lmom for prescreen  

## 2018-08-26 ENCOUNTER — Other Ambulatory Visit: Payer: Self-pay

## 2018-08-26 ENCOUNTER — Ambulatory Visit (INDEPENDENT_AMBULATORY_CARE_PROVIDER_SITE_OTHER): Payer: Medicare Other | Admitting: *Deleted

## 2018-08-26 DIAGNOSIS — Z7901 Long term (current) use of anticoagulants: Secondary | ICD-10-CM

## 2018-08-26 DIAGNOSIS — Z952 Presence of prosthetic heart valve: Secondary | ICD-10-CM

## 2018-08-26 LAB — POCT INR: INR: 3.2 — AB (ref 2.0–3.0)

## 2018-08-26 NOTE — Patient Instructions (Signed)
Description   Today only take 1/2 tablet, then start taking 1 tablet daily except 1/2 tablet on Sundays and Wednesdays.  Repeat INR in 3 weeks. Call us with any new medications or any concerns # [phone].

## 2018-08-26 NOTE — Telephone Encounter (Signed)

## 2018-09-10 ENCOUNTER — Telehealth: Payer: Self-pay

## 2018-09-10 NOTE — Telephone Encounter (Signed)

## 2018-09-16 ENCOUNTER — Ambulatory Visit (INDEPENDENT_AMBULATORY_CARE_PROVIDER_SITE_OTHER): Payer: Medicare Other | Admitting: *Deleted

## 2018-09-16 ENCOUNTER — Other Ambulatory Visit: Payer: Self-pay

## 2018-09-16 DIAGNOSIS — Z7901 Long term (current) use of anticoagulants: Secondary | ICD-10-CM

## 2018-09-16 DIAGNOSIS — Z952 Presence of prosthetic heart valve: Secondary | ICD-10-CM

## 2018-09-16 LAB — POCT INR: INR: 2.6 (ref 2.0–3.0)

## 2018-09-16 NOTE — Patient Instructions (Signed)
Description   Continue taking 1 tablet daily except 1/2 tablet on Sundays and Wednesdays.  Repeat INR in 4 weeks. Call us with any new medications or any concerns # [phone].

## 2018-10-07 ENCOUNTER — Telehealth: Payer: Self-pay

## 2018-10-07 NOTE — Telephone Encounter (Signed)

## 2018-10-14 ENCOUNTER — Other Ambulatory Visit: Payer: Self-pay

## 2018-10-14 ENCOUNTER — Ambulatory Visit (INDEPENDENT_AMBULATORY_CARE_PROVIDER_SITE_OTHER): Payer: Medicare Other | Admitting: *Deleted

## 2018-10-14 DIAGNOSIS — Z952 Presence of prosthetic heart valve: Secondary | ICD-10-CM

## 2018-10-14 DIAGNOSIS — Z7901 Long term (current) use of anticoagulants: Secondary | ICD-10-CM

## 2018-10-14 LAB — POCT INR: INR: 3.2 — AB (ref 2.0–3.0)

## 2018-10-14 NOTE — Patient Instructions (Signed)
Description   Skip today's dose, then continue taking 1 tablet daily except 1/2 tablet on Sundays and Wednesdays.  Repeat INR in 4 weeks. Call us with any new medications or any concerns # [phone].

## 2018-10-20 ENCOUNTER — Telehealth: Payer: Self-pay

## 2018-10-20 NOTE — Telephone Encounter (Signed)
Left message for patient to call regading 10/22/18 appointment @ 11:15 with Dr. Diona Foley to be an in office visit or reschedule to VIRTUAL day

## 2018-10-20 NOTE — Telephone Encounter (Signed)
Dr. Berry will only see office visits on his full days. Please call patient to request that they convert 7/22 virtual visit to an office visit. If patient insists on having a virtual visit, please reschedule them for the next available virtual visit day (half days). 

## 2018-10-21 NOTE — Telephone Encounter (Signed)

## 2018-10-22 ENCOUNTER — Encounter: Payer: Self-pay | Admitting: Cardiovascular Disease

## 2018-10-22 ENCOUNTER — Ambulatory Visit (INDEPENDENT_AMBULATORY_CARE_PROVIDER_SITE_OTHER): Payer: Medicare Other | Admitting: Cardiovascular Disease

## 2018-10-22 ENCOUNTER — Other Ambulatory Visit: Payer: Self-pay

## 2018-10-22 DIAGNOSIS — E782 Mixed hyperlipidemia: Secondary | ICD-10-CM | POA: Diagnosis not present

## 2018-10-22 DIAGNOSIS — Z952 Presence of prosthetic heart valve: Secondary | ICD-10-CM

## 2018-10-22 DIAGNOSIS — I10 Essential (primary) hypertension: Secondary | ICD-10-CM | POA: Diagnosis not present

## 2018-10-22 NOTE — Assessment & Plan Note (Signed)
History of hyperlipidemia on statin therapy with lipid profile performed 07/02/2017 revealing total cholesterol 163, LDL of 82 and HDL of 65.

## 2018-10-22 NOTE — Patient Instructions (Signed)
Medication Instructions:  Your physician recommends that you continue on your current medications as directed. Please refer to the Current Medication list given to you today.  If you need a refill on your cardiac medications before your next appointment, please call your pharmacy.   Lab work: NONE If you have labs (blood work) drawn today and your tests are completely normal, you will receive your results only by: [name] MyChart Message (if you have MyChart) OR . A paper copy in the mail If you have any lab test that is abnormal or we need to change your treatment, we will call you to review the results.  Testing/Procedures: Your physician has requested that you have an echocardiogram. Echocardiography is a painless test that uses sound waves to create images of your heart. It provides your doctor with information about the size and shape of your heart and how well your heart's chambers and valves are working. This procedure takes approximately one hour. There are no restrictions for this procedure. LOCATION; [facility]: [place], [place], [place] [address]  TO BE SCHEDULED FOR AUGUST 2020  Follow-Up: At [facility], you and your health needs are our priority.  As part of our continuing mission to provide you with exceptional heart care, we have created designated Provider Care Teams.  These Care Teams include your primary Cardiologist (physician) and Advanced Practice Providers (APPs -  Physician Assistants and Nurse Practitioners) who all work together to provide you with the care you need, when you need it. You will need a follow up appointment in 12 months WITH DR. Gwenlyn Found.  Please call our office 2 months in advance to schedule this appointment.

## 2018-10-22 NOTE — Assessment & Plan Note (Signed)
History of essential hypertension with blood pressure measured today 140/60.  She is on amlodipine and metoprolol.  Continue current meds

## 2018-10-22 NOTE — Assessment & Plan Note (Signed)
History of mechanical aortic valve replacement by Dr. Roxy Manns in 1998 with what sounds like a [place] valve for bicuspid aortic stenosis.  2D echo performed 11/26/2017 revealed normal LV function with a well-functioning aortic mechanical prosthesis.  She is on Coumadin anticoagulation.

## 2018-10-22 NOTE — Progress Notes (Signed)
10/22/2018 Julie Mcmahon   Jan 11, 1928  [id number]  Primary Physician Deland Pretty, MD Primary Cardiologist: Lorretta Harp MD [name], [place]  HPI:  Julie Mcmahon is a 85 y.o.  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 I last saw her  10/30/2017.[name] She has known valvular heart disease status post aortic valve replacement with a St. Jude mechanical valve by Dr. Lilly Cove October 1998 for what sounds like bicuspid aortic stenosis. She is on chronic Coumadin anticoagulation. Her other problems include hypertension and hyperlipidemia. She is aware of SBE prophylaxis. She denies chest pain or shortness of breath. Her last echo performed  11/26/2017, showed a well-functioning aortic mechanical prosthesis.. Dr. Shelia Media follows her lipid profile closely  Since I saw her a year ago she is remained stable.  She is sheltering in place and socially distancing.  She denies chest pain or shortness of breath.   Current Meds  Medication Sig  . amLODipine (NORVASC) 2.5 MG tablet Take 2.5 mg by mouth daily.  . Ascorbic Acid (VITAMIN C) 100 MG tablet Take 100 mg by mouth daily.  [name] atorvastatin (LIPITOR) 10 MG tablet Take 10 mg by mouth every other day.  . B Complex Vitamins (B COMPLEX 100 PO) Take 100 mg by mouth daily.  . Calcium Carbonate-Vitamin D 600-200 MG-UNIT TABS Take 1 tablet by mouth daily.  . hydrochlorothiazide (HYDRODIURIL) 25 MG tablet Take 25 mg by mouth daily.  . metoprolol (LOPRESSOR) 50 MG tablet Take 25 mg by mouth daily.  . vitamin E 400 UNIT capsule Take 400 Units by mouth daily.  [name] warfarin (COUMADIN) 2.5 MG tablet TAKE 1/2 TO TABLET BY MOUTH ONCE DAILY AS DIRECTED BY COUMADIN CLINIC.     No Known Allergies  Social History   Socioeconomic History  . Marital status: Married    Spouse name: Not on file  . Number of children: Not on file  . Years of education: Not on  file  . Highest education level: Not on file  Occupational History  . Not on file  Social Needs  . Financial resource strain: Not on file  . Food insecurity    Worry: Not on file    Inability: Not on file  . Transportation needs    Medical: Not on file    Non-medical: Not on file  Tobacco Use  . Smoking status: Never Smoker  . Smokeless tobacco: Never Used  Substance and Sexual Activity  . Alcohol use: Not on file  . Drug use: Not on file  . Sexual activity: Not on file  Lifestyle  . Physical activity    Days per week: Not on file    Minutes per session: Not on file  . Stress: Not on file  Relationships  . Social Herbalist on phone: Not on file    Gets together: Not on file    Attends religious service: Not on file    Active member of club or organization: Not on file    Attends meetings of clubs or organizations: Not on file    Relationship status: Not on file  . Intimate partner violence    Fear of current or ex partner: Not on file    Emotionally abused: Not on file    Physically abused: Not on file    Forced sexual activity: Not on file  Other Topics Concern  .  Not on file  Social History Narrative  . Not on file     Review of Systems: General: negative for chills, fever, night sweats or weight changes.  Cardiovascular: negative for chest pain, dyspnea on exertion, edema, orthopnea, palpitations, paroxysmal nocturnal dyspnea or shortness of breath Dermatological: negative for rash Respiratory: negative for cough or wheezing Urologic: negative for hematuria Abdominal: negative for nausea, vomiting, diarrhea, bright red blood per rectum, melena, or hematemesis Neurologic: negative for visual changes, syncope, or dizziness All other systems reviewed and are otherwise negative except as noted above.    Blood pressure 140/60, pulse 81, temperature (!) 97 F (36.1 C), height 5\' 2"  (1.575 m), weight 125 lb (56.7 kg).  General appearance: alert and no  distress Neck: no adenopathy, no carotid bruit, no JVD, supple, symmetrical, trachea midline and thyroid not enlarged, symmetric, no tenderness/mass/nodules Lungs: clear to auscultation bilaterally Heart: Crisp mechanical prosthetic aortic valve sounds Extremities: extremities normal, atraumatic, no cyanosis or edema Pulses: 2+ and symmetric Skin: Skin color, texture, turgor normal. No rashes or lesions Neurologic: Alert and oriented X 3, normal strength and tone. Normal symmetric reflexes. Normal coordination and gait  EKG sinus rhythm 81 with evidence of LVH with repolarization changes and septal Q waves.  I personally reviewed this EKG.  ASSESSMENT AND PLAN:   H/O aortic valve replacement History of mechanical aortic valve replacement by Dr. Roxy Manns in 1998 with what sounds like a [place] valve for bicuspid aortic stenosis.  2D echo performed 11/26/2017 revealed normal LV function with a well-functioning aortic mechanical prosthesis.  She is on Coumadin anticoagulation.  Essential hypertension History of essential hypertension with blood pressure measured today 140/60.  She is on amlodipine and metoprolol.  Continue current meds  Hyperlipidemia History of hyperlipidemia on statin therapy with lipid profile performed 07/02/2017 revealing total cholesterol 163, LDL of 82 and HDL of 65.      Lorretta Harp MD FACP,FACC,FAHA, [facility] 10/22/2018 11:23 AM

## 2018-11-11 ENCOUNTER — Ambulatory Visit (INDEPENDENT_AMBULATORY_CARE_PROVIDER_SITE_OTHER): Payer: Medicare Other | Admitting: *Deleted

## 2018-11-11 DIAGNOSIS — M25532 Pain in left wrist: Secondary | ICD-10-CM | POA: Diagnosis not present

## 2018-11-11 DIAGNOSIS — R2232 Localized swelling, mass and lump, left upper limb: Secondary | ICD-10-CM | POA: Diagnosis not present

## 2018-11-11 DIAGNOSIS — Z952 Presence of prosthetic heart valve: Secondary | ICD-10-CM | POA: Diagnosis not present

## 2018-11-11 DIAGNOSIS — Z7901 Long term (current) use of anticoagulants: Secondary | ICD-10-CM

## 2018-11-11 DIAGNOSIS — M19042 Primary osteoarthritis, left hand: Secondary | ICD-10-CM | POA: Diagnosis not present

## 2018-11-11 LAB — POCT INR: INR: 2.1 (ref 2.0–3.0)

## 2018-11-11 NOTE — Patient Instructions (Signed)
Description   Continue taking 1 tablet daily except 1/2 tablet on Sundays and Wednesdays.  Repeat INR in 4 weeks. Call us with any new medications or any concerns # [phone].

## 2018-12-01 ENCOUNTER — Ambulatory Visit (HOSPITAL_COMMUNITY): Payer: Medicare Other | Attending: Cardiovascular Disease

## 2018-12-01 ENCOUNTER — Other Ambulatory Visit: Payer: Self-pay | Admitting: *Deleted

## 2018-12-01 ENCOUNTER — Other Ambulatory Visit: Payer: Self-pay

## 2018-12-01 DIAGNOSIS — Z952 Presence of prosthetic heart valve: Secondary | ICD-10-CM

## 2018-12-09 ENCOUNTER — Ambulatory Visit (INDEPENDENT_AMBULATORY_CARE_PROVIDER_SITE_OTHER): Payer: Medicare Other | Admitting: *Deleted

## 2018-12-09 ENCOUNTER — Other Ambulatory Visit: Payer: Self-pay

## 2018-12-09 DIAGNOSIS — Z7901 Long term (current) use of anticoagulants: Secondary | ICD-10-CM | POA: Diagnosis not present

## 2018-12-09 DIAGNOSIS — Z952 Presence of prosthetic heart valve: Secondary | ICD-10-CM | POA: Diagnosis not present

## 2018-12-09 LAB — POCT INR: INR: 3 (ref 2.0–3.0)

## 2018-12-09 NOTE — Patient Instructions (Signed)
Description   Continue taking 1 tablet daily except 1/2 tablet on Sundays and Wednesdays.  Repeat INR in 4 weeks. Call us with any new medications or any concerns # [phone].      

## 2019-01-06 ENCOUNTER — Ambulatory Visit (INDEPENDENT_AMBULATORY_CARE_PROVIDER_SITE_OTHER): Payer: Medicare Other | Admitting: Pharmacist

## 2019-01-06 ENCOUNTER — Other Ambulatory Visit: Payer: Self-pay

## 2019-01-06 DIAGNOSIS — Z952 Presence of prosthetic heart valve: Secondary | ICD-10-CM

## 2019-01-06 DIAGNOSIS — Z7901 Long term (current) use of anticoagulants: Secondary | ICD-10-CM

## 2019-01-06 LAB — POCT INR: INR: 2.6 (ref 2.0–3.0)

## 2019-01-06 NOTE — Patient Instructions (Signed)
Description   Continue taking 1 tablet daily except 1/2 tablet on Sundays and Wednesdays.  Repeat INR in 6 weeks. Call us with any new medications or any concerns # [phone].

## 2019-01-14 ENCOUNTER — Other Ambulatory Visit: Payer: Self-pay | Admitting: Cardiovascular Disease

## 2019-02-17 ENCOUNTER — Other Ambulatory Visit: Payer: Self-pay

## 2019-02-17 ENCOUNTER — Ambulatory Visit (INDEPENDENT_AMBULATORY_CARE_PROVIDER_SITE_OTHER): Payer: Medicare Other | Admitting: Pharmacist

## 2019-02-17 DIAGNOSIS — Z952 Presence of prosthetic heart valve: Secondary | ICD-10-CM | POA: Diagnosis not present

## 2019-02-17 DIAGNOSIS — Z7901 Long term (current) use of anticoagulants: Secondary | ICD-10-CM | POA: Diagnosis not present

## 2019-02-17 LAB — POCT INR: INR: 2.7 (ref 2.0–3.0)

## 2019-02-17 NOTE — Patient Instructions (Signed)
Continue taking 1 tablet daily except 1/2 tablet on Sundays and Wednesdays.  Repeat INR in 6 weeks. Call us with any new medications or any concerns # [phone].

## 2019-02-25 ENCOUNTER — Other Ambulatory Visit: Payer: Self-pay

## 2019-03-31 ENCOUNTER — Ambulatory Visit (INDEPENDENT_AMBULATORY_CARE_PROVIDER_SITE_OTHER): Payer: Medicare Other | Admitting: Pharmacist Clinician (PhC)/ Clinical Pharmacy Specialist

## 2019-03-31 ENCOUNTER — Other Ambulatory Visit: Payer: Self-pay

## 2019-03-31 DIAGNOSIS — Z952 Presence of prosthetic heart valve: Secondary | ICD-10-CM | POA: Diagnosis not present

## 2019-03-31 DIAGNOSIS — Z7901 Long term (current) use of anticoagulants: Secondary | ICD-10-CM | POA: Diagnosis not present

## 2019-03-31 LAB — POCT INR: INR: 2.4 (ref 2.0–3.0)

## 2019-05-06 ENCOUNTER — Telehealth: Payer: Self-pay | Admitting: Cardiovascular Disease

## 2019-05-06 NOTE — Telephone Encounter (Signed)
We are recommending the COVID-19 vaccine to all of our patients. Cardiac medications (including blood thinners) should not deter anyone from being vaccinated and there is no need to hold any of those medications prior to vaccine administration.     Currently, there is a hotline to call (active 04/10/19) to schedule vaccination appointments as no walk-ins will be accepted.   Number: [phone].    If an appointment is not available please go to [email or web address] to sign up for notification when additional vaccine appointments are available.   If you have further questions or concerns about the vaccine process, please visit [email or web address] or contact your primary care physician.  I have informed the patient of the instructions.

## 2019-05-12 ENCOUNTER — Other Ambulatory Visit: Payer: Self-pay

## 2019-05-12 ENCOUNTER — Ambulatory Visit (INDEPENDENT_AMBULATORY_CARE_PROVIDER_SITE_OTHER): Payer: Medicare Other | Admitting: Pharmacist

## 2019-05-12 DIAGNOSIS — Z952 Presence of prosthetic heart valve: Secondary | ICD-10-CM

## 2019-05-12 DIAGNOSIS — Z7901 Long term (current) use of anticoagulants: Secondary | ICD-10-CM | POA: Diagnosis not present

## 2019-05-12 LAB — POCT INR: INR: 2.3 (ref 2.0–3.0)

## 2019-06-23 ENCOUNTER — Ambulatory Visit (INDEPENDENT_AMBULATORY_CARE_PROVIDER_SITE_OTHER): Payer: Medicare Other | Admitting: Pharmacist

## 2019-06-23 ENCOUNTER — Other Ambulatory Visit: Payer: Self-pay

## 2019-06-23 DIAGNOSIS — Z7901 Long term (current) use of anticoagulants: Secondary | ICD-10-CM

## 2019-06-23 DIAGNOSIS — Z952 Presence of prosthetic heart valve: Secondary | ICD-10-CM | POA: Diagnosis not present

## 2019-06-23 LAB — POCT INR: INR: 2.6 (ref 2.0–3.0)

## 2019-06-23 NOTE — Patient Instructions (Signed)
Continue taking 1 tablet daily except 1/2 tablet on Sundays and Wednesdays.  Repeat INR in 8 weeks. Call us with any new medications or any concerns # [phone]. (updated per patient dosing)

## 2019-07-27 ENCOUNTER — Other Ambulatory Visit: Payer: Self-pay | Admitting: Cardiovascular Disease

## 2019-08-14 DIAGNOSIS — E7800 Pure hypercholesterolemia, unspecified: Secondary | ICD-10-CM | POA: Diagnosis not present

## 2019-08-14 DIAGNOSIS — I10 Essential (primary) hypertension: Secondary | ICD-10-CM | POA: Diagnosis not present

## 2019-08-18 ENCOUNTER — Ambulatory Visit (INDEPENDENT_AMBULATORY_CARE_PROVIDER_SITE_OTHER): Payer: Medicare Other | Admitting: Pharmacist

## 2019-08-18 ENCOUNTER — Other Ambulatory Visit: Payer: Self-pay

## 2019-08-18 DIAGNOSIS — Z952 Presence of prosthetic heart valve: Secondary | ICD-10-CM

## 2019-08-18 DIAGNOSIS — K219 Gastro-esophageal reflux disease without esophagitis: Secondary | ICD-10-CM | POA: Diagnosis not present

## 2019-08-18 DIAGNOSIS — I10 Essential (primary) hypertension: Secondary | ICD-10-CM | POA: Diagnosis not present

## 2019-08-18 DIAGNOSIS — M8580 Other specified disorders of bone density and structure, unspecified site: Secondary | ICD-10-CM | POA: Diagnosis not present

## 2019-08-18 DIAGNOSIS — Z0000 Encounter for general adult medical examination without abnormal findings: Secondary | ICD-10-CM | POA: Diagnosis not present

## 2019-08-18 DIAGNOSIS — M19041 Primary osteoarthritis, right hand: Secondary | ICD-10-CM | POA: Diagnosis not present

## 2019-08-18 DIAGNOSIS — I2510 Atherosclerotic heart disease of native coronary artery without angina pectoris: Secondary | ICD-10-CM | POA: Diagnosis not present

## 2019-08-18 DIAGNOSIS — M159 Polyosteoarthritis, unspecified: Secondary | ICD-10-CM | POA: Diagnosis not present

## 2019-08-18 DIAGNOSIS — Z7901 Long term (current) use of anticoagulants: Secondary | ICD-10-CM

## 2019-08-18 DIAGNOSIS — E7800 Pure hypercholesterolemia, unspecified: Secondary | ICD-10-CM | POA: Diagnosis not present

## 2019-08-18 DIAGNOSIS — M19042 Primary osteoarthritis, left hand: Secondary | ICD-10-CM | POA: Diagnosis not present

## 2019-08-18 LAB — POCT INR: INR: 2.9 (ref 2.0–3.0)

## 2019-08-18 NOTE — Patient Instructions (Signed)
Continue taking 1 tablet daily except 1/2 tablet on Sundays and Wednesdays.  Repeat INR in 8 weeks. Call us with any new medications or any concerns # [phone]. (updated per patient dosing)

## 2019-08-25 DIAGNOSIS — D485 Neoplasm of uncertain behavior of skin: Secondary | ICD-10-CM | POA: Diagnosis not present

## 2019-09-01 DIAGNOSIS — M810 Age-related osteoporosis without current pathological fracture: Secondary | ICD-10-CM | POA: Diagnosis not present

## 2019-09-01 DIAGNOSIS — I10 Essential (primary) hypertension: Secondary | ICD-10-CM | POA: Diagnosis not present

## 2019-09-11 DIAGNOSIS — J069 Acute upper respiratory infection, unspecified: Secondary | ICD-10-CM | POA: Diagnosis not present

## 2019-10-13 ENCOUNTER — Other Ambulatory Visit: Payer: Self-pay

## 2019-10-13 ENCOUNTER — Ambulatory Visit (INDEPENDENT_AMBULATORY_CARE_PROVIDER_SITE_OTHER): Payer: Medicare Other | Admitting: Pharmacist Clinician (PhC)/ Clinical Pharmacy Specialist

## 2019-10-13 DIAGNOSIS — Z952 Presence of prosthetic heart valve: Secondary | ICD-10-CM

## 2019-10-13 DIAGNOSIS — Z7901 Long term (current) use of anticoagulants: Secondary | ICD-10-CM

## 2019-10-13 LAB — POCT INR: INR: 4.5 — AB (ref 2.0–3.0)

## 2019-10-13 NOTE — Patient Instructions (Signed)
No warfarin for 2 days, then decrease dose to 1 tablet daily except 1/2 tablet each Sunday and Wednesday.  Repeat INR in 2 weeks

## 2019-10-15 DIAGNOSIS — Z952 Presence of prosthetic heart valve: Secondary | ICD-10-CM | POA: Diagnosis not present

## 2019-10-15 DIAGNOSIS — R791 Abnormal coagulation profile: Secondary | ICD-10-CM | POA: Diagnosis not present

## 2019-10-15 DIAGNOSIS — J159 Unspecified bacterial pneumonia: Secondary | ICD-10-CM | POA: Diagnosis not present

## 2019-10-21 DIAGNOSIS — I10 Essential (primary) hypertension: Secondary | ICD-10-CM | POA: Diagnosis not present

## 2019-11-03 ENCOUNTER — Other Ambulatory Visit: Payer: Self-pay

## 2019-11-03 ENCOUNTER — Ambulatory Visit (INDEPENDENT_AMBULATORY_CARE_PROVIDER_SITE_OTHER): Payer: Medicare Other

## 2019-11-03 DIAGNOSIS — Z952 Presence of prosthetic heart valve: Secondary | ICD-10-CM

## 2019-11-03 DIAGNOSIS — Z7901 Long term (current) use of anticoagulants: Secondary | ICD-10-CM | POA: Diagnosis not present

## 2019-11-03 LAB — POCT INR: INR: 2.9 (ref 2.0–3.0)

## 2019-11-03 NOTE — Patient Instructions (Signed)
Continue taking 1 tablet daily except 1/2 tablet each Sunday and Wednesday.  Repeat INR in 4 weeks

## 2019-11-12 DIAGNOSIS — I10 Essential (primary) hypertension: Secondary | ICD-10-CM | POA: Diagnosis not present

## 2019-12-01 ENCOUNTER — Ambulatory Visit (INDEPENDENT_AMBULATORY_CARE_PROVIDER_SITE_OTHER): Payer: Medicare Other

## 2019-12-01 ENCOUNTER — Other Ambulatory Visit: Payer: Self-pay

## 2019-12-01 DIAGNOSIS — Z952 Presence of prosthetic heart valve: Secondary | ICD-10-CM

## 2019-12-01 DIAGNOSIS — Z7901 Long term (current) use of anticoagulants: Secondary | ICD-10-CM | POA: Diagnosis not present

## 2019-12-01 LAB — POCT INR: INR: 1.8 — AB (ref 2.0–3.0)

## 2019-12-01 NOTE — Patient Instructions (Signed)
Take 1.5 tablets today and then Continue taking 1 tablet daily except 1/2 tablet each Sunday and Wednesday.  Repeat INR in 4 weeks.  Watch salad intake.

## 2019-12-22 ENCOUNTER — Other Ambulatory Visit (HOSPITAL_COMMUNITY): Payer: Medicare Other

## 2019-12-28 ENCOUNTER — Other Ambulatory Visit: Payer: Self-pay

## 2019-12-28 DIAGNOSIS — Z7901 Long term (current) use of anticoagulants: Secondary | ICD-10-CM

## 2019-12-30 DIAGNOSIS — Z7901 Long term (current) use of anticoagulants: Secondary | ICD-10-CM | POA: Diagnosis not present

## 2019-12-31 LAB — PROTIME-INR
INR: 1.7 — ABNORMAL HIGH (ref 0.9–1.2)
Prothrombin Time: 17.4 s — ABNORMAL HIGH (ref 9.1–12.0)

## 2020-01-01 ENCOUNTER — Ambulatory Visit (INDEPENDENT_AMBULATORY_CARE_PROVIDER_SITE_OTHER): Payer: Medicare Other | Admitting: Pharmacist Clinician (PhC)/ Clinical Pharmacy Specialist

## 2020-01-01 DIAGNOSIS — Z7901 Long term (current) use of anticoagulants: Secondary | ICD-10-CM

## 2020-01-01 DIAGNOSIS — Z952 Presence of prosthetic heart valve: Secondary | ICD-10-CM | POA: Diagnosis not present

## 2020-01-05 ENCOUNTER — Other Ambulatory Visit: Payer: Self-pay

## 2020-01-05 ENCOUNTER — Ambulatory Visit (HOSPITAL_COMMUNITY): Payer: Medicare Other | Attending: Cardiology

## 2020-01-05 DIAGNOSIS — Z952 Presence of prosthetic heart valve: Secondary | ICD-10-CM

## 2020-01-05 LAB — ECHOCARDIOGRAM COMPLETE
AV Mean grad: 9 mmHg
AV Peak grad: 15.5 mmHg
Ao pk vel: 1.97 m/s
Area-P 1/2: 4.06 cm2
S' Lateral: 2.9 cm

## 2020-01-06 ENCOUNTER — Other Ambulatory Visit: Payer: Self-pay

## 2020-01-06 DIAGNOSIS — Z952 Presence of prosthetic heart valve: Secondary | ICD-10-CM

## 2020-01-18 ENCOUNTER — Telehealth: Payer: Self-pay

## 2020-01-18 DIAGNOSIS — Z23 Encounter for immunization: Secondary | ICD-10-CM | POA: Diagnosis not present

## 2020-01-18 DIAGNOSIS — Z79899 Other long term (current) drug therapy: Secondary | ICD-10-CM | POA: Diagnosis not present

## 2020-01-18 NOTE — Telephone Encounter (Signed)
lmomed for overdue inr

## 2020-01-20 ENCOUNTER — Telehealth: Payer: Self-pay

## 2020-01-20 ENCOUNTER — Other Ambulatory Visit: Payer: Self-pay

## 2020-01-20 DIAGNOSIS — Z7901 Long term (current) use of anticoagulants: Secondary | ICD-10-CM

## 2020-01-20 NOTE — Telephone Encounter (Signed)
Mailed coumadin orders to take to the lab since I was unable to leave a msg on either phone line

## 2020-01-21 ENCOUNTER — Other Ambulatory Visit: Payer: Self-pay | Admitting: Cardiovascular Disease

## 2020-01-21 DIAGNOSIS — Z7901 Long term (current) use of anticoagulants: Secondary | ICD-10-CM

## 2020-01-21 DIAGNOSIS — Z952 Presence of prosthetic heart valve: Secondary | ICD-10-CM

## 2020-01-26 ENCOUNTER — Other Ambulatory Visit: Payer: Self-pay

## 2020-01-26 ENCOUNTER — Ambulatory Visit (INDEPENDENT_AMBULATORY_CARE_PROVIDER_SITE_OTHER): Payer: Medicare Other | Admitting: Pharmacist

## 2020-01-26 DIAGNOSIS — Z952 Presence of prosthetic heart valve: Secondary | ICD-10-CM

## 2020-01-26 DIAGNOSIS — Z7901 Long term (current) use of anticoagulants: Secondary | ICD-10-CM | POA: Diagnosis not present

## 2020-01-26 LAB — POCT INR: INR: 2.2 (ref 2.0–3.0)

## 2020-02-23 ENCOUNTER — Other Ambulatory Visit: Payer: Self-pay

## 2020-02-23 ENCOUNTER — Ambulatory Visit (INDEPENDENT_AMBULATORY_CARE_PROVIDER_SITE_OTHER): Payer: Medicare Other

## 2020-02-23 DIAGNOSIS — Z7901 Long term (current) use of anticoagulants: Secondary | ICD-10-CM

## 2020-02-23 DIAGNOSIS — Z952 Presence of prosthetic heart valve: Secondary | ICD-10-CM | POA: Diagnosis not present

## 2020-02-23 LAB — POCT INR: INR: 2.6 (ref 2.0–3.0)

## 2020-02-23 NOTE — Patient Instructions (Signed)
Continue taking 1 tablet daily except 1/2 tablet each Wednesday.  Repeat INR in 6 weeks.  

## 2020-04-05 ENCOUNTER — Other Ambulatory Visit: Payer: Self-pay

## 2020-04-05 ENCOUNTER — Ambulatory Visit (INDEPENDENT_AMBULATORY_CARE_PROVIDER_SITE_OTHER): Payer: Medicare Other

## 2020-04-05 DIAGNOSIS — Z7901 Long term (current) use of anticoagulants: Secondary | ICD-10-CM | POA: Diagnosis not present

## 2020-04-05 DIAGNOSIS — Z952 Presence of prosthetic heart valve: Secondary | ICD-10-CM

## 2020-04-05 LAB — POCT INR: INR: 2.4 (ref 2.0–3.0)

## 2020-04-05 NOTE — Patient Instructions (Signed)
Continue taking 1 tablet daily except 1/2 tablet each Wednesday.  Repeat INR in 6 weeks.  

## 2020-05-17 ENCOUNTER — Ambulatory Visit (INDEPENDENT_AMBULATORY_CARE_PROVIDER_SITE_OTHER): Payer: Medicare Other

## 2020-05-17 ENCOUNTER — Other Ambulatory Visit: Payer: Self-pay

## 2020-05-17 DIAGNOSIS — Z952 Presence of prosthetic heart valve: Secondary | ICD-10-CM

## 2020-05-17 DIAGNOSIS — Z7901 Long term (current) use of anticoagulants: Secondary | ICD-10-CM

## 2020-05-17 LAB — POCT INR: INR: 2.5 (ref 2.0–3.0)

## 2020-05-17 NOTE — Patient Instructions (Signed)
Continue taking 1 tablet daily except 1/2 tablet each Wednesday.  Repeat INR in 6 weeks.  

## 2020-06-28 ENCOUNTER — Other Ambulatory Visit: Payer: Self-pay

## 2020-06-28 ENCOUNTER — Ambulatory Visit (INDEPENDENT_AMBULATORY_CARE_PROVIDER_SITE_OTHER): Payer: Medicare Other

## 2020-06-28 DIAGNOSIS — Z952 Presence of prosthetic heart valve: Secondary | ICD-10-CM | POA: Diagnosis not present

## 2020-06-28 DIAGNOSIS — Z7901 Long term (current) use of anticoagulants: Secondary | ICD-10-CM

## 2020-06-28 LAB — POCT INR: INR: 2.1 (ref 2.0–3.0)

## 2020-06-28 NOTE — Patient Instructions (Signed)
Continue taking 1 tablet daily except 1/2 tablet each Wednesday.  Repeat INR in 6 weeks.

## 2020-06-30 ENCOUNTER — Other Ambulatory Visit: Payer: Self-pay | Admitting: Cardiovascular Disease

## 2020-06-30 DIAGNOSIS — Z952 Presence of prosthetic heart valve: Secondary | ICD-10-CM

## 2020-06-30 DIAGNOSIS — Z7901 Long term (current) use of anticoagulants: Secondary | ICD-10-CM

## 2020-07-13 DIAGNOSIS — J0100 Acute maxillary sinusitis, unspecified: Secondary | ICD-10-CM | POA: Diagnosis not present

## 2020-07-28 DIAGNOSIS — N819 Female genital prolapse, unspecified: Secondary | ICD-10-CM | POA: Diagnosis not present

## 2020-07-30 DIAGNOSIS — N819 Female genital prolapse, unspecified: Secondary | ICD-10-CM | POA: Diagnosis not present

## 2020-08-03 DIAGNOSIS — J0100 Acute maxillary sinusitis, unspecified: Secondary | ICD-10-CM | POA: Diagnosis not present

## 2020-08-03 DIAGNOSIS — N3091 Cystitis, unspecified with hematuria: Secondary | ICD-10-CM | POA: Diagnosis not present

## 2020-08-03 DIAGNOSIS — R300 Dysuria: Secondary | ICD-10-CM | POA: Diagnosis not present

## 2020-08-05 DIAGNOSIS — N819 Female genital prolapse, unspecified: Secondary | ICD-10-CM | POA: Diagnosis not present

## 2020-08-09 ENCOUNTER — Other Ambulatory Visit: Payer: Self-pay

## 2020-08-09 ENCOUNTER — Ambulatory Visit (INDEPENDENT_AMBULATORY_CARE_PROVIDER_SITE_OTHER): Payer: Medicare Other

## 2020-08-09 DIAGNOSIS — Z7901 Long term (current) use of anticoagulants: Secondary | ICD-10-CM

## 2020-08-09 DIAGNOSIS — Z952 Presence of prosthetic heart valve: Secondary | ICD-10-CM | POA: Diagnosis not present

## 2020-08-09 LAB — POCT INR: INR: 2.9 (ref 2.0–3.0)

## 2020-08-09 NOTE — Patient Instructions (Signed)
Continue taking 1 tablet daily except 1/2 tablet each Wednesday.  Repeat INR in 8 weeks.

## 2020-10-05 ENCOUNTER — Other Ambulatory Visit: Payer: Self-pay

## 2020-10-05 ENCOUNTER — Ambulatory Visit (INDEPENDENT_AMBULATORY_CARE_PROVIDER_SITE_OTHER): Payer: Medicare Other | Admitting: Cardiovascular Disease

## 2020-10-05 ENCOUNTER — Encounter: Payer: Self-pay | Admitting: Cardiovascular Disease

## 2020-10-05 ENCOUNTER — Ambulatory Visit (INDEPENDENT_AMBULATORY_CARE_PROVIDER_SITE_OTHER): Payer: Medicare Other

## 2020-10-05 VITALS — BP 166/62 | HR 81 | Ht 62.0 in | Wt 124.4 lb

## 2020-10-05 DIAGNOSIS — Z952 Presence of prosthetic heart valve: Secondary | ICD-10-CM

## 2020-10-05 DIAGNOSIS — E782 Mixed hyperlipidemia: Secondary | ICD-10-CM | POA: Diagnosis not present

## 2020-10-05 DIAGNOSIS — I10 Essential (primary) hypertension: Secondary | ICD-10-CM

## 2020-10-05 DIAGNOSIS — Z7901 Long term (current) use of anticoagulants: Secondary | ICD-10-CM

## 2020-10-05 LAB — POCT INR: INR: 1.9 — AB (ref 2.0–3.0)

## 2020-10-05 NOTE — Assessment & Plan Note (Signed)
History of Saint Jude AVR by Dr. Patric Dykes oh in October 1998 for bicuspid aortic stenosis.  She is on Coumadin oral anticoagulation.  Her last echo performed 01/05/2020 showed normal LV systolic function, grade 1 diastolic dysfunction and a well-functioning aortic mechanical prosthesis.  She is totally asymptomatic.  We will recheck a 2D echo in October.

## 2020-10-05 NOTE — Patient Instructions (Signed)
Take 1 tablet today only and then Continue taking 1 tablet daily except 1/2 tablet each Wednesday.  Repeat INR in 6 weeks.

## 2020-10-05 NOTE — Assessment & Plan Note (Signed)
History of hyperlipidemia on statin therapy followed by her PCP. 

## 2020-10-05 NOTE — Progress Notes (Signed)
10/05/2020 Julie Mcmahon   07-Mar-1928  [id number]  Primary Physician Deland Pretty, MD Primary Cardiologist: Lorretta Harp MD Julie Mcmahon, [place]  HPI:  Julie Mcmahon is a 87 y.o.  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 I last saw her 10/22/2018.[name]  She lives in the same house that she has been in for the last 25 years and is independent.  Her granddaughter lives next door.  She has known valvular heart disease status post aortic valve replacement with a St. Jude mechanical valve by Dr. Lilly Mcmahon October 1998 for what sounds like bicuspid aortic stenosis. She is on chronic Coumadin anticoagulation. Her other problems include hypertension and hyperlipidemia. She is aware of SBE prophylaxis. She denies chest pain or shortness of breath. Her last echo performed   11/26/2017, showed a well-functioning aortic mechanical prosthesis.. Dr. Shelia Media follows her lipid profile closely   Since I saw her a year ago she is remained stable.  She denies chest pain or shortness of breath.  2D echo performed 01/05/2020 revealed normal LV systolic function, grade 1 diastolic dysfunction and normally functioning aortic mechanical prosthesis.  Current Meds  Medication Sig   amLODipine (NORVASC) 2.5 MG tablet Take 2.5 mg by mouth daily.   Ascorbic Acid (VITAMIN C) 100 MG tablet Take 100 mg by mouth daily.   atorvastatin (LIPITOR) 10 MG tablet Take 10 mg by mouth every other day.   B Complex Vitamins (B COMPLEX 100 PO) Take 100 mg by mouth daily.   Calcium Carbonate-Vitamin D 600-200 MG-UNIT TABS Take 1 tablet by mouth daily.   hydrochlorothiazide (HYDRODIURIL) 25 MG tablet Take 25 mg by mouth daily.   metoprolol (LOPRESSOR) 50 MG tablet Take 25 mg by mouth daily.   vitamin E 400 UNIT capsule Take 400 Units by mouth daily.   warfarin (COUMADIN) 2.5 MG tablet TAKE 1/2 TO 1 TABLET BY MOUTH ONCE DAILYAS  DIRECTED BY COUMADIN CLINIC.     No Known Allergies  Social History   Socioeconomic History   Marital status: Married    Spouse name: Not on file   Number of children: Not on file   Years of education: Not on file   Highest education level: Not on file  Occupational History   Not on file  Tobacco Use   Smoking status: Never   Smokeless tobacco: Never  Substance and Sexual Activity   Alcohol use: Not on file   Drug use: Not on file   Sexual activity: Not on file  Other Topics Concern   Not on file  Social History Narrative   Not on file   Social Determinants of Health   Financial Resource Strain: Not on file  Food Insecurity: Not on file  Transportation Needs: Not on file  Physical Activity: Not on file  Stress: Not on file  Social Connections: Not on file  Intimate Partner Violence: Not on file     Review of Systems: General: negative for chills, fever, night sweats or weight changes.  Cardiovascular: negative for chest pain, dyspnea on exertion, edema, orthopnea, palpitations, paroxysmal nocturnal dyspnea or shortness of breath Dermatological: negative for rash Respiratory: negative for cough or wheezing Urologic: negative for hematuria Abdominal: negative for nausea, vomiting, diarrhea, bright red blood per rectum, melena, or hematemesis Neurologic: negative for visual changes, syncope, or dizziness All other systems reviewed and are otherwise negative except as noted above.  Blood pressure (!) 166/62, pulse 81, height 5\' 2"  (1.575 m), weight 124 lb 6.4 oz (56.4 kg), SpO2 98 %.  General appearance: alert and no distress Neck: no adenopathy, no carotid bruit, no JVD, supple, symmetrical, trachea midline, and thyroid not enlarged, symmetric, no tenderness/mass/nodules Lungs: clear to auscultation bilaterally Heart: Crisp prosthetic aortic valve sounds. Extremities: extremities normal, atraumatic, no cyanosis or edema Pulses: 2+ and symmetric Skin: Skin  color, texture, turgor normal. No rashes or lesions Neurologic: Grossly normal  EKG sinus rhythm at 81 with LVH voltage and anterolateral T wave inversion unchanged from prior EKGs.  There were septal Q waves as well.  I personally reviewed this EKG.  ASSESSMENT AND PLAN:   H/O aortic valve replacement History of Saint Jude AVR by Dr. Patric Dykes oh in October 1998 for bicuspid aortic stenosis.  She is on Coumadin oral anticoagulation.  Her last echo performed 01/05/2020 showed normal LV systolic function, grade 1 diastolic dysfunction and a well-functioning aortic mechanical prosthesis.  She is totally asymptomatic.  We will recheck a 2D echo in October.  Essential hypertension History of essential hypertension a blood pressure measured in the office today of 166/62.  She did measure her own blood pressure at home prior to coming to her visit which was 124/65.  She is on amlodipine, HydroDIURIL and metoprolol.  Hyperlipidemia History of hyperlipidemia on statin therapy followed by her PCP.     Lorretta Harp MD [place], [facility] 10/05/2020 9:34 AM

## 2020-10-05 NOTE — Patient Instructions (Signed)
Medication Instructions:  No Changes In Medications at this time. *If you need a refill on your cardiac medications before your next appointment, please call your pharmacy*  Testing/Procedures: Your physician has requested that you have an echocardiogram in OCTOBER. Echocardiography is a painless test that uses sound waves to create images of your heart. It provides your doctor with information about the size and shape of your heart and how well your heart's chambers and valves are working. You may receive an ultrasound enhancing agent through an IV if needed to better visualize your heart during the echo.This procedure takes approximately one hour. There are no restrictions for this procedure. This will take place at the [address].   Follow-Up: At [facility], you and your health needs are our priority.  As part of our continuing mission to provide you with exceptional heart care, we have created designated Provider Care Teams.  These Care Teams include your primary Cardiologist (physician) and Advanced Practice Providers (APPs -  Physician Assistants and Nurse Practitioners) who all work together to provide you with the care you need, when you need it.  Your next appointment:   1 year(s)  The format for your next appointment:   In Person  Provider:   Quay Burow, MD

## 2020-10-05 NOTE — Assessment & Plan Note (Signed)
History of essential hypertension a blood pressure measured in the office today of 166/62.  She did measure her own blood pressure at home prior to coming to her visit which was 124/65.  She is on amlodipine, HydroDIURIL and metoprolol.

## 2020-11-08 DIAGNOSIS — L814 Other melanin hyperpigmentation: Secondary | ICD-10-CM | POA: Diagnosis not present

## 2020-11-08 DIAGNOSIS — L821 Other seborrheic keratosis: Secondary | ICD-10-CM | POA: Diagnosis not present

## 2020-11-08 DIAGNOSIS — L309 Dermatitis, unspecified: Secondary | ICD-10-CM | POA: Diagnosis not present

## 2020-11-15 ENCOUNTER — Other Ambulatory Visit: Payer: Self-pay

## 2020-11-15 ENCOUNTER — Ambulatory Visit (INDEPENDENT_AMBULATORY_CARE_PROVIDER_SITE_OTHER): Payer: Medicare Other

## 2020-11-15 DIAGNOSIS — Z7901 Long term (current) use of anticoagulants: Secondary | ICD-10-CM | POA: Diagnosis not present

## 2020-11-15 DIAGNOSIS — Z952 Presence of prosthetic heart valve: Secondary | ICD-10-CM

## 2020-11-15 LAB — POCT INR: INR: 2.2 (ref 2.0–3.0)

## 2020-11-15 NOTE — Patient Instructions (Signed)
Description   Continue taking 1 tablet daily except 1/2 tablet each Wednesday. Repeat INR in 7 weeks.

## 2021-01-02 ENCOUNTER — Ambulatory Visit (HOSPITAL_COMMUNITY): Payer: Medicare Other | Attending: Cardiovascular Disease

## 2021-01-02 ENCOUNTER — Ambulatory Visit (INDEPENDENT_AMBULATORY_CARE_PROVIDER_SITE_OTHER): Payer: Medicare Other

## 2021-01-02 ENCOUNTER — Other Ambulatory Visit: Payer: Self-pay

## 2021-01-02 DIAGNOSIS — Z5181 Encounter for therapeutic drug level monitoring: Secondary | ICD-10-CM | POA: Diagnosis not present

## 2021-01-02 DIAGNOSIS — I10 Essential (primary) hypertension: Secondary | ICD-10-CM | POA: Insufficient documentation

## 2021-01-02 DIAGNOSIS — Z952 Presence of prosthetic heart valve: Secondary | ICD-10-CM | POA: Diagnosis not present

## 2021-01-02 DIAGNOSIS — E782 Mixed hyperlipidemia: Secondary | ICD-10-CM | POA: Diagnosis not present

## 2021-01-02 LAB — ECHOCARDIOGRAM COMPLETE
AR max vel: 0.86 cm2
AV Area VTI: 0.85 cm2
AV Area mean vel: 0.81 cm2
AV Mean grad: 12 mmHg
AV Peak grad: 23.4 mmHg
Ao pk vel: 2.42 m/s
Area-P 1/2: 3.53 cm2
S' Lateral: 2.3 cm

## 2021-01-02 LAB — POCT INR: INR: 2.3 (ref 2.0–3.0)

## 2021-01-02 NOTE — Progress Notes (Unsigned)
Patient declined consent for the administration of Definity.

## 2021-01-02 NOTE — Patient Instructions (Addendum)
Description   Continue taking 1 tablet daily except 1/2 tablet each Wednesday. Repeat INR in 6 weeks.

## 2021-01-03 DIAGNOSIS — E7800 Pure hypercholesterolemia, unspecified: Secondary | ICD-10-CM | POA: Diagnosis not present

## 2021-01-03 DIAGNOSIS — I10 Essential (primary) hypertension: Secondary | ICD-10-CM | POA: Diagnosis not present

## 2021-01-11 DIAGNOSIS — Z952 Presence of prosthetic heart valve: Secondary | ICD-10-CM | POA: Diagnosis not present

## 2021-01-11 DIAGNOSIS — D6869 Other thrombophilia: Secondary | ICD-10-CM | POA: Diagnosis not present

## 2021-01-11 DIAGNOSIS — M810 Age-related osteoporosis without current pathological fracture: Secondary | ICD-10-CM | POA: Diagnosis not present

## 2021-01-11 DIAGNOSIS — I10 Essential (primary) hypertension: Secondary | ICD-10-CM | POA: Diagnosis not present

## 2021-01-11 DIAGNOSIS — Z0000 Encounter for general adult medical examination without abnormal findings: Secondary | ICD-10-CM | POA: Diagnosis not present

## 2021-01-11 DIAGNOSIS — E7800 Pure hypercholesterolemia, unspecified: Secondary | ICD-10-CM | POA: Diagnosis not present

## 2021-01-11 DIAGNOSIS — L578 Other skin changes due to chronic exposure to nonionizing radiation: Secondary | ICD-10-CM | POA: Diagnosis not present

## 2021-01-11 DIAGNOSIS — Z23 Encounter for immunization: Secondary | ICD-10-CM | POA: Diagnosis not present

## 2021-01-11 DIAGNOSIS — I2510 Atherosclerotic heart disease of native coronary artery without angina pectoris: Secondary | ICD-10-CM | POA: Diagnosis not present

## 2021-01-12 ENCOUNTER — Telehealth: Payer: Self-pay | Admitting: Cardiovascular Disease

## 2021-01-12 NOTE — Telephone Encounter (Signed)
Patient is calling for her echo results.   

## 2021-01-13 NOTE — Telephone Encounter (Signed)
The patient has been notified of the results regarding echo and verbalized understanding.  All questions (if any) were answered. Beatrix Fetters, RN 01/13/2021 8:29 AM

## 2021-01-13 NOTE — Telephone Encounter (Signed)
Left message for pt to call back  °

## 2021-01-25 ENCOUNTER — Other Ambulatory Visit: Payer: Self-pay | Admitting: Cardiovascular Disease

## 2021-01-25 DIAGNOSIS — Z952 Presence of prosthetic heart valve: Secondary | ICD-10-CM

## 2021-01-25 DIAGNOSIS — Z7901 Long term (current) use of anticoagulants: Secondary | ICD-10-CM

## 2021-02-14 ENCOUNTER — Other Ambulatory Visit: Payer: Self-pay

## 2021-02-14 ENCOUNTER — Ambulatory Visit (INDEPENDENT_AMBULATORY_CARE_PROVIDER_SITE_OTHER): Payer: Medicare Other

## 2021-02-14 DIAGNOSIS — Z952 Presence of prosthetic heart valve: Secondary | ICD-10-CM | POA: Diagnosis not present

## 2021-02-14 DIAGNOSIS — Z5181 Encounter for therapeutic drug level monitoring: Secondary | ICD-10-CM | POA: Diagnosis not present

## 2021-02-14 LAB — POCT INR: INR: 1.8 — AB (ref 2.0–3.0)

## 2021-02-14 NOTE — Patient Instructions (Signed)
Description   Take 1.5 tablets today and then continue taking 1 tablet daily except 1/2 tablet each Wednesday. Repeat INR in 6 weeks.

## 2021-02-27 DIAGNOSIS — J342 Deviated nasal septum: Secondary | ICD-10-CM | POA: Diagnosis not present

## 2021-02-27 DIAGNOSIS — J3489 Other specified disorders of nose and nasal sinuses: Secondary | ICD-10-CM | POA: Diagnosis not present

## 2021-03-07 DIAGNOSIS — J0100 Acute maxillary sinusitis, unspecified: Secondary | ICD-10-CM | POA: Diagnosis not present

## 2021-03-07 DIAGNOSIS — R0981 Nasal congestion: Secondary | ICD-10-CM | POA: Diagnosis not present

## 2021-03-28 ENCOUNTER — Other Ambulatory Visit: Payer: Self-pay

## 2021-03-28 ENCOUNTER — Ambulatory Visit (INDEPENDENT_AMBULATORY_CARE_PROVIDER_SITE_OTHER): Payer: Medicare Other

## 2021-03-28 DIAGNOSIS — Z7901 Long term (current) use of anticoagulants: Secondary | ICD-10-CM

## 2021-03-28 DIAGNOSIS — Z5181 Encounter for therapeutic drug level monitoring: Secondary | ICD-10-CM

## 2021-03-28 DIAGNOSIS — Z952 Presence of prosthetic heart valve: Secondary | ICD-10-CM

## 2021-03-28 LAB — POCT INR: INR: 2.9 (ref 2.0–3.0)

## 2021-03-28 NOTE — Patient Instructions (Signed)
Description   Continue taking 1 tablet daily except 1/2 tablet each Wednesday. Repeat INR in 6 weeks.

## 2021-05-09 ENCOUNTER — Other Ambulatory Visit: Payer: Self-pay

## 2021-05-09 ENCOUNTER — Ambulatory Visit (INDEPENDENT_AMBULATORY_CARE_PROVIDER_SITE_OTHER): Payer: Medicare Other

## 2021-05-09 DIAGNOSIS — Z5181 Encounter for therapeutic drug level monitoring: Secondary | ICD-10-CM | POA: Diagnosis not present

## 2021-05-09 DIAGNOSIS — Z952 Presence of prosthetic heart valve: Secondary | ICD-10-CM

## 2021-05-09 LAB — POCT INR: INR: 2.6 (ref 2.0–3.0)

## 2021-05-09 NOTE — Patient Instructions (Signed)
Description   Continue taking 1 tablet daily except 1/2 tablet each Wednesday.  Repeat INR in 6 weeks.  Cou[facility]in Clinic #[phone]      

## 2021-05-22 DIAGNOSIS — R0981 Nasal congestion: Secondary | ICD-10-CM | POA: Diagnosis not present

## 2021-05-22 DIAGNOSIS — J0100 Acute maxillary sinusitis, unspecified: Secondary | ICD-10-CM | POA: Diagnosis not present

## 2021-06-19 DIAGNOSIS — H353131 Nonexudative age-related macular degeneration, bilateral, early dry stage: Secondary | ICD-10-CM | POA: Diagnosis not present

## 2021-06-19 DIAGNOSIS — H40013 Open angle with borderline findings, low risk, bilateral: Secondary | ICD-10-CM | POA: Diagnosis not present

## 2021-06-20 ENCOUNTER — Other Ambulatory Visit: Payer: Self-pay

## 2021-06-20 ENCOUNTER — Ambulatory Visit (INDEPENDENT_AMBULATORY_CARE_PROVIDER_SITE_OTHER): Payer: Medicare Other

## 2021-06-20 DIAGNOSIS — Z952 Presence of prosthetic heart valve: Secondary | ICD-10-CM | POA: Diagnosis not present

## 2021-06-20 DIAGNOSIS — Z7901 Long term (current) use of anticoagulants: Secondary | ICD-10-CM | POA: Diagnosis not present

## 2021-06-20 LAB — POCT INR: INR: 2.7 (ref 2.0–3.0)

## 2021-06-20 NOTE — Patient Instructions (Signed)
Description   Continue taking 1 tablet daily except 1/2 tablet each Wednesday.  Repeat INR in 6 weeks.  Cou[facility]in Clinic #[phone]      

## 2021-07-27 ENCOUNTER — Other Ambulatory Visit: Payer: Self-pay | Admitting: Cardiovascular Disease

## 2021-07-27 DIAGNOSIS — Z7901 Long term (current) use of anticoagulants: Secondary | ICD-10-CM

## 2021-07-27 DIAGNOSIS — Z952 Presence of prosthetic heart valve: Secondary | ICD-10-CM

## 2021-07-27 DIAGNOSIS — H6693 Otitis media, unspecified, bilateral: Secondary | ICD-10-CM | POA: Diagnosis not present

## 2021-08-01 ENCOUNTER — Ambulatory Visit (INDEPENDENT_AMBULATORY_CARE_PROVIDER_SITE_OTHER): Payer: Medicare Other

## 2021-08-01 DIAGNOSIS — Z7901 Long term (current) use of anticoagulants: Secondary | ICD-10-CM

## 2021-08-01 DIAGNOSIS — Z952 Presence of prosthetic heart valve: Secondary | ICD-10-CM | POA: Diagnosis not present

## 2021-08-01 LAB — POCT INR: INR: 2.5 (ref 2.0–3.0)

## 2021-08-01 NOTE — Patient Instructions (Signed)
Description   Continue taking 1 tablet daily except 1/2 tablet each Wednesday.  Repeat INR in 6 weeks.  Cou[facility]in Clinic #[phone]      

## 2021-09-19 ENCOUNTER — Ambulatory Visit (INDEPENDENT_AMBULATORY_CARE_PROVIDER_SITE_OTHER): Payer: Medicare Other

## 2021-09-19 DIAGNOSIS — Z952 Presence of prosthetic heart valve: Secondary | ICD-10-CM

## 2021-09-19 DIAGNOSIS — Z7901 Long term (current) use of anticoagulants: Secondary | ICD-10-CM

## 2021-09-19 LAB — POCT INR: INR: 2.8 (ref 2.0–3.0)

## 2021-09-19 NOTE — Patient Instructions (Signed)
Description   Continue taking 1 tablet daily except 1/2 tablet each Wednesday.  Repeat INR in 6 weeks.  Cou[facility]in Clinic #[phone]      

## 2021-09-21 DIAGNOSIS — M25559 Pain in unspecified hip: Secondary | ICD-10-CM | POA: Diagnosis not present

## 2021-09-21 DIAGNOSIS — S0083XA Contusion of other part of head, initial encounter: Secondary | ICD-10-CM | POA: Diagnosis not present

## 2021-09-21 DIAGNOSIS — H02052 Trichiasis without entropian right lower eyelid: Secondary | ICD-10-CM | POA: Diagnosis not present

## 2021-10-31 ENCOUNTER — Ambulatory Visit (INDEPENDENT_AMBULATORY_CARE_PROVIDER_SITE_OTHER): Payer: Medicare Other

## 2021-10-31 DIAGNOSIS — Z952 Presence of prosthetic heart valve: Secondary | ICD-10-CM | POA: Diagnosis not present

## 2021-10-31 DIAGNOSIS — Z7901 Long term (current) use of anticoagulants: Secondary | ICD-10-CM | POA: Diagnosis not present

## 2021-10-31 LAB — POCT INR: INR: 2.7 (ref 2.0–3.0)

## 2021-10-31 NOTE — Patient Instructions (Signed)
Description   Continue taking 1 tablet daily except 1/2 tablet each Wednesday.  Repeat INR in 6 weeks.  Coumadin Clinic [phone]

## 2021-11-25 DIAGNOSIS — R059 Cough, unspecified: Secondary | ICD-10-CM | POA: Diagnosis not present

## 2021-11-25 DIAGNOSIS — J069 Acute upper respiratory infection, unspecified: Secondary | ICD-10-CM | POA: Diagnosis not present

## 2021-11-25 DIAGNOSIS — R0981 Nasal congestion: Secondary | ICD-10-CM | POA: Diagnosis not present

## 2021-12-12 ENCOUNTER — Ambulatory Visit: Payer: Medicare Other | Attending: Cardiology

## 2021-12-12 DIAGNOSIS — Z7901 Long term (current) use of anticoagulants: Secondary | ICD-10-CM | POA: Diagnosis not present

## 2021-12-12 DIAGNOSIS — Z952 Presence of prosthetic heart valve: Secondary | ICD-10-CM | POA: Diagnosis not present

## 2021-12-12 LAB — POCT INR: INR: 3 (ref 2.0–3.0)

## 2021-12-12 NOTE — Patient Instructions (Signed)
Description   Continue taking 1 tablet daily except 1/2 tablet each Wednesday.  Repeat INR in 6 weeks.  Coumadin Clinic [phone]

## 2022-01-09 DIAGNOSIS — I10 Essential (primary) hypertension: Secondary | ICD-10-CM | POA: Diagnosis not present

## 2022-01-09 DIAGNOSIS — I2510 Atherosclerotic heart disease of native coronary artery without angina pectoris: Secondary | ICD-10-CM | POA: Diagnosis not present

## 2022-01-09 DIAGNOSIS — E7800 Pure hypercholesterolemia, unspecified: Secondary | ICD-10-CM | POA: Diagnosis not present

## 2022-01-10 ENCOUNTER — Ambulatory Visit: Payer: Medicare Other | Attending: Cardiovascular Disease | Admitting: Cardiovascular Disease

## 2022-01-10 ENCOUNTER — Encounter: Payer: Self-pay | Admitting: Cardiovascular Disease

## 2022-01-10 DIAGNOSIS — E782 Mixed hyperlipidemia: Secondary | ICD-10-CM | POA: Diagnosis not present

## 2022-01-10 DIAGNOSIS — Z952 Presence of prosthetic heart valve: Secondary | ICD-10-CM | POA: Diagnosis not present

## 2022-01-10 DIAGNOSIS — I10 Essential (primary) hypertension: Secondary | ICD-10-CM | POA: Insufficient documentation

## 2022-01-10 NOTE — Patient Instructions (Signed)
Medication Instructions:  Your physician recommends that you continue on your current medications as directed. Please refer to the Current Medication list given to you today.  *If you need a refill on your cardiac medications before your next appointment, please call your pharmacy*   Testing/Procedures: Your physician has requested that you have an echocardiogram. Echocardiography is a painless test that uses sound waves to create images of your heart. It provides your doctor with information about the size and shape of your heart and how well your heart's chambers and valves are working. This procedure takes approximately one hour. There are no restrictions for this procedure. This procedure will be done at [address]    Follow-Up: At [facility], you and your health needs are our priority.  As part of our continuing mission to provide you with exceptional heart care, we have created designated Provider Care Teams.  These Care Teams include your primary Cardiologist (physician) and Advanced Practice Providers (APPs -  Physician Assistants and Nurse Practitioners) who all work together to provide you with the care you need, when you need it.  We recommend signing up for the patient portal called "MyChart".  Sign up information is provided on this After Visit Summary.  MyChart is used to connect with patients for Virtual Visits (Telemedicine).  Patients are able to view lab/test results, encounter notes, upcoming appointments, etc.  Non-urgent messages can be sent to your provider as well.   To learn more about what you can do with MyChart, go to [email or web address].    Your next appointment:   12 month(s)  The format for your next appointment:   In Person  Provider:   Quay Burow, MD

## 2022-01-10 NOTE — Assessment & Plan Note (Signed)
History of St. Jude's AVR by Dr. Ether Griffins  October 1998 for what sounds like bicuspid aortic stenosis.  She is on chronic Coumadin anticoagulation followed in [place].  She is completely asymptomatic.  Her last 2D echo performed 01/02/2021 revealed normal LV systolic function with a well-functioning aortic mechanical prosthesis.  We will we will continue to follow this on an annual basis.

## 2022-01-10 NOTE — Progress Notes (Signed)
01/10/2022 [name]   Apr 08, 1927  [id number]  Primary Physician Deland Pretty, MD Primary Cardiologist: Lorretta Harp MD [name], [place]  HPI:  Julie Mcmahon is a 88 y.o.  thin-appearing, widowed Caucasian female, mother of 34, grandmother to 3 grandchildren who is retired from working a Special educational needs teacher in [place]. I last saw her 10/05/2020.[name]  She lives in the same house that she has been in for the last 25 years and is independent.  Her granddaughter lives next door.  She has known valvular heart disease status post aortic valve replacement with a St. Jude mechanical valve by Dr. Lilly Cove October 1998 for what sounds like bicuspid aortic stenosis. She is on chronic Coumadin anticoagulation. Her other problems include hypertension and hyperlipidemia. She is aware of SBE prophylaxis. She denies chest pain or shortness of breath. Her last echo performed   11/26/2017, showed a well-functioning aortic mechanical prosthesis.. Dr. Shelia Media follows her lipid profile closely   Since I saw her a year ago she is remained stable.  She denies chest pain or shortness of breath.  2D echo performed 01/02/2021 revealed normal LV systolic function, grade 1 diastolic dysfunction and normally functioning aortic mechanical prosthesis   Current Meds  Medication Sig   amLODipine (NORVASC) 2.5 MG tablet Take 2.5 mg by mouth daily.   Ascorbic Acid (VITAMIN C) 100 MG tablet Take 100 mg by mouth daily.   atorvastatin (LIPITOR) 10 MG tablet Take 10 mg by mouth every other day.   B Complex Vitamins (B COMPLEX 100 PO) Take 100 mg by mouth daily.   Calcium Carbonate-Vitamin D 600-200 MG-UNIT TABS Take 1 tablet by mouth daily.   hydrochlorothiazide (HYDRODIURIL) 25 MG tablet Take 25 mg by mouth daily.   metoprolol (LOPRESSOR) 50 MG tablet Take 25 mg by mouth daily.   vitamin E 400 UNIT capsule Take 400 Units by mouth daily.   warfarin (COUMADIN) 2.5 MG tablet TAKE 1-2 TABLETS BY MOUTH ONCE DAILY OR AS  DIRECTED BY COUMADIN CLINIC.     No Known Allergies  Social History   Socioeconomic History   Marital status: Married    Spouse name: Not on file   Number of children: Not on file   Years of education: Not on file   Highest education level: Not on file  Occupational History   Not on file  Tobacco Use   Smoking status: Never   Smokeless tobacco: Never  Substance and Sexual Activity   Alcohol use: Not on file   Drug use: Not on file   Sexual activity: Not on file  Other Topics Concern   Not on file  Social History Narrative   Not on file   Social Determinants of Health   Financial Resource Strain: Not on file  Food Insecurity: Not on file  Transportation Needs: Not on file  Physical Activity: Not on file  Stress: Not on file  Social Connections: Not on file  Intimate Partner Violence: Not on file     Review of Systems: General: negative for chills, fever, night sweats or weight changes.  Cardiovascular: negative for chest pain, dyspnea on exertion, edema, orthopnea, palpitations, paroxysmal nocturnal dyspnea or shortness of breath Dermatological: negative for rash Respiratory: negative for cough or wheezing Urologic: negative for hematuria Abdominal: negative for nausea, vomiting, diarrhea, bright red blood per rectum, melena, or hematemesis Neurologic: negative for visual changes, syncope, or dizziness All other systems reviewed and are otherwise negative except as noted above.  Blood pressure 138/60, pulse 72, height '5\' 2"'$  (1.575 m), weight 122 lb (55.3 kg).  General appearance: alert and no distress Neck: no adenopathy, no carotid bruit, no JVD, supple, symmetrical, trachea midline, and thyroid not enlarged, symmetric, no tenderness/mass/nodules Lungs: clear to auscultation bilaterally Heart: Crisp prosthetic mechanical aortic valve sounds. Extremities: extremities normal, atraumatic, no cyanosis or edema Pulses: 2+ and symmetric Skin: Skin color, texture,  turgor normal. No rashes or lesions Neurologic: Grossly normal  EKG sinus rhythm at 72 with LVH voltage and repolarization changes with septal Q waves.  I personally reviewed this EKG.  ASSESSMENT AND PLAN:   H/O aortic valve replacement History of St. Jude's AVR by Dr. Ether Griffins  October 1998 for what sounds like bicuspid aortic stenosis.  She is on chronic Coumadin anticoagulation followed in [place].  She is completely asymptomatic.  Her last 2D echo performed 01/02/2021 revealed normal LV systolic function with a well-functioning aortic mechanical prosthesis.  We will we will continue to follow this on an annual basis.  Essential hypertension History of essential hypertension a blood pressure measured today at 138/60.  She is on low-dose amlodipine, hydrochlorothiazide and metoprolol  Hyperlipidemia History of hyperlipidemia on statin therapy with lipid profile performed 01/03/2021 revealing total cholesterol 148, LDL 71 and HDL 64.     Lorretta Harp MD [facility], [facility] 01/10/2022 10:23 AM

## 2022-01-10 NOTE — Assessment & Plan Note (Signed)
History of hyperlipidemia on statin therapy with lipid profile performed 01/03/2021 revealing total cholesterol 148, LDL 71 and HDL 64.

## 2022-01-10 NOTE — Assessment & Plan Note (Signed)
History of essential hypertension a blood pressure measured today at 138/60.  She is on low-dose amlodipine, hydrochlorothiazide and metoprolol

## 2022-01-16 DIAGNOSIS — I10 Essential (primary) hypertension: Secondary | ICD-10-CM | POA: Diagnosis not present

## 2022-01-16 DIAGNOSIS — D6869 Other thrombophilia: Secondary | ICD-10-CM | POA: Diagnosis not present

## 2022-01-16 DIAGNOSIS — Z7901 Long term (current) use of anticoagulants: Secondary | ICD-10-CM | POA: Diagnosis not present

## 2022-01-16 DIAGNOSIS — Z952 Presence of prosthetic heart valve: Secondary | ICD-10-CM | POA: Diagnosis not present

## 2022-01-16 DIAGNOSIS — H9201 Otalgia, right ear: Secondary | ICD-10-CM | POA: Diagnosis not present

## 2022-01-16 DIAGNOSIS — M810 Age-related osteoporosis without current pathological fracture: Secondary | ICD-10-CM | POA: Diagnosis not present

## 2022-01-16 DIAGNOSIS — I2510 Atherosclerotic heart disease of native coronary artery without angina pectoris: Secondary | ICD-10-CM | POA: Diagnosis not present

## 2022-01-16 DIAGNOSIS — Z0000 Encounter for general adult medical examination without abnormal findings: Secondary | ICD-10-CM | POA: Diagnosis not present

## 2022-01-16 DIAGNOSIS — Z23 Encounter for immunization: Secondary | ICD-10-CM | POA: Diagnosis not present

## 2022-01-23 ENCOUNTER — Ambulatory Visit: Payer: Medicare Other | Attending: Cardiology

## 2022-01-23 DIAGNOSIS — Z952 Presence of prosthetic heart valve: Secondary | ICD-10-CM | POA: Diagnosis not present

## 2022-01-23 DIAGNOSIS — Z7901 Long term (current) use of anticoagulants: Secondary | ICD-10-CM

## 2022-01-23 LAB — POCT INR: INR: 2.6 (ref 2.0–3.0)

## 2022-01-23 NOTE — Patient Instructions (Signed)
Description   Continue taking 1 tablet daily except 1/2 tablet each Wednesday.  Repeat INR in 7 weeks.  [facility]in Clinic #[phone]      

## 2022-02-07 ENCOUNTER — Other Ambulatory Visit: Payer: Self-pay | Admitting: Cardiovascular Disease

## 2022-02-07 DIAGNOSIS — Z952 Presence of prosthetic heart valve: Secondary | ICD-10-CM

## 2022-02-07 DIAGNOSIS — Z7901 Long term (current) use of anticoagulants: Secondary | ICD-10-CM

## 2022-02-07 NOTE — Telephone Encounter (Signed)
Prescription refill request received for warfarin Lov: 01/10/22 Julie Mcmahon)  Next INR check: 03/13/22 Warfarin tablet strength: 2.'5mg'$   Appropriate dose and refill sent to requested pharmacy.

## 2022-02-12 DIAGNOSIS — H6121 Impacted cerumen, right ear: Secondary | ICD-10-CM | POA: Diagnosis not present

## 2022-02-12 DIAGNOSIS — H7321 Unspecified myringitis, right ear: Secondary | ICD-10-CM | POA: Diagnosis not present

## 2022-02-27 DIAGNOSIS — M810 Age-related osteoporosis without current pathological fracture: Secondary | ICD-10-CM | POA: Diagnosis not present

## 2022-02-27 DIAGNOSIS — I10 Essential (primary) hypertension: Secondary | ICD-10-CM | POA: Diagnosis not present

## 2022-03-13 ENCOUNTER — Ambulatory Visit: Payer: Medicare Other | Attending: Cardiology

## 2022-03-13 DIAGNOSIS — Z7901 Long term (current) use of anticoagulants: Secondary | ICD-10-CM | POA: Diagnosis not present

## 2022-03-13 DIAGNOSIS — Z952 Presence of prosthetic heart valve: Secondary | ICD-10-CM | POA: Diagnosis not present

## 2022-03-13 LAB — POCT INR: INR: 2.5 (ref 2.0–3.0)

## 2022-03-13 NOTE — Patient Instructions (Signed)
Description   Continue taking 1 tablet daily except 1/2 tablet each Wednesday.  Repeat INR in 7 weeks.  Coumadin Clinic [phone]

## 2022-04-13 ENCOUNTER — Ambulatory Visit: Payer: Medicare Other | Attending: Cardiology

## 2022-04-13 ENCOUNTER — Other Ambulatory Visit (HOSPITAL_COMMUNITY): Payer: Medicare Other

## 2022-04-13 DIAGNOSIS — Z952 Presence of prosthetic heart valve: Secondary | ICD-10-CM | POA: Insufficient documentation

## 2022-04-13 DIAGNOSIS — E782 Mixed hyperlipidemia: Secondary | ICD-10-CM

## 2022-04-13 DIAGNOSIS — I10 Essential (primary) hypertension: Secondary | ICD-10-CM | POA: Diagnosis not present

## 2022-04-13 LAB — ECHOCARDIOGRAM COMPLETE
AR max vel: 0.97 cm2
AV Area VTI: 1.02 cm2
AV Area mean vel: 0.93 cm2
AV Mean grad: 15.5 mmHg
AV Peak grad: 29.1 mmHg
Ao pk vel: 2.7 m/s
Area-P 1/2: 3.48 cm2
S' Lateral: 2.7 cm

## 2022-05-01 ENCOUNTER — Ambulatory Visit: Payer: Medicare Other | Attending: Cardiology

## 2022-05-01 DIAGNOSIS — Z7901 Long term (current) use of anticoagulants: Secondary | ICD-10-CM

## 2022-05-01 DIAGNOSIS — Z952 Presence of prosthetic heart valve: Secondary | ICD-10-CM

## 2022-05-01 LAB — POCT INR: INR: 3.2 — AB (ref 2.0–3.0)

## 2022-05-01 NOTE — Patient Instructions (Signed)
Description   Only take 1/2 tablet today and then continue taking 1 tablet daily except 1/2 tablet each Wednesday.  Repeat INR in 6 weeks.  Coumadin Clinic [phone]

## 2022-06-12 ENCOUNTER — Ambulatory Visit: Payer: Medicare Other | Attending: Cardiovascular Disease

## 2022-06-12 DIAGNOSIS — Z952 Presence of prosthetic heart valve: Secondary | ICD-10-CM

## 2022-06-12 DIAGNOSIS — Z5181 Encounter for therapeutic drug level monitoring: Secondary | ICD-10-CM | POA: Insufficient documentation

## 2022-06-12 LAB — POCT INR: INR: 2.6 (ref 2.0–3.0)

## 2022-06-12 NOTE — Patient Instructions (Signed)
Description   Continue taking 1 tablet daily except 1/2 tablet each Wednesday.  Repeat INR in 7 weeks.  [facility]in Clinic #[phone]      

## 2022-07-25 ENCOUNTER — Other Ambulatory Visit: Payer: Self-pay | Admitting: Cardiovascular Disease

## 2022-07-25 DIAGNOSIS — Z7901 Long term (current) use of anticoagulants: Secondary | ICD-10-CM

## 2022-07-25 DIAGNOSIS — Z952 Presence of prosthetic heart valve: Secondary | ICD-10-CM

## 2022-07-26 DIAGNOSIS — J0100 Acute maxillary sinusitis, unspecified: Secondary | ICD-10-CM | POA: Diagnosis not present

## 2022-07-26 NOTE — Telephone Encounter (Signed)
Warfarin 2.5mg  refill H/O aortic valve replacement  Last INR 06/12/22 Last OV 01/10/22

## 2022-07-31 ENCOUNTER — Ambulatory Visit: Payer: Medicare Other | Attending: Cardiology

## 2022-07-31 DIAGNOSIS — Z952 Presence of prosthetic heart valve: Secondary | ICD-10-CM | POA: Diagnosis not present

## 2022-07-31 LAB — POCT INR: INR: 3.3 — AB (ref 2.0–3.0)

## 2022-07-31 NOTE — Patient Instructions (Signed)
Description   Only take 1/2 tablet today and then continue taking 1 tablet daily except 1/2 tablet each Wednesday.  Repeat INR in 6 weeks.  C[facility]madin Clinic #[phone]      

## 2022-08-30 DIAGNOSIS — J069 Acute upper respiratory infection, unspecified: Secondary | ICD-10-CM | POA: Diagnosis not present

## 2022-08-30 DIAGNOSIS — R0981 Nasal congestion: Secondary | ICD-10-CM | POA: Diagnosis not present

## 2022-09-11 ENCOUNTER — Ambulatory Visit: Payer: Medicare Other | Attending: Cardiovascular Disease

## 2022-09-11 DIAGNOSIS — Z952 Presence of prosthetic heart valve: Secondary | ICD-10-CM | POA: Diagnosis not present

## 2022-09-11 DIAGNOSIS — Z5181 Encounter for therapeutic drug level monitoring: Secondary | ICD-10-CM | POA: Diagnosis not present

## 2022-09-11 LAB — POCT INR: INR: 2.9 (ref 2.0–3.0)

## 2022-09-11 NOTE — Patient Instructions (Signed)
Description   Continue taking 1 tablet daily except 1/2 tablet each Wednesday.  Repeat INR in 6 weeks.  Cou[facility]in Clinic #[phone]      

## 2022-10-23 ENCOUNTER — Ambulatory Visit: Payer: Medicare Other | Attending: Cardiology

## 2022-10-23 DIAGNOSIS — Z952 Presence of prosthetic heart valve: Secondary | ICD-10-CM | POA: Insufficient documentation

## 2022-10-23 DIAGNOSIS — Z5181 Encounter for therapeutic drug level monitoring: Secondary | ICD-10-CM | POA: Insufficient documentation

## 2022-10-23 LAB — POCT INR: INR: 2.9 (ref 2.0–3.0)

## 2022-10-23 NOTE — Patient Instructions (Signed)
Description   Continue taking 1 tablet daily except 1/2 tablet each Wednesday.  Repeat INR in 7 weeks.  [facility]in Clinic #[phone]      

## 2022-12-04 DIAGNOSIS — R0981 Nasal congestion: Secondary | ICD-10-CM | POA: Diagnosis not present

## 2022-12-11 ENCOUNTER — Ambulatory Visit: Payer: Medicare Other | Attending: Cardiovascular Disease

## 2022-12-11 DIAGNOSIS — Z952 Presence of prosthetic heart valve: Secondary | ICD-10-CM | POA: Diagnosis not present

## 2022-12-11 DIAGNOSIS — Z5181 Encounter for therapeutic drug level monitoring: Secondary | ICD-10-CM | POA: Insufficient documentation

## 2022-12-11 DIAGNOSIS — Z7901 Long term (current) use of anticoagulants: Secondary | ICD-10-CM | POA: Insufficient documentation

## 2022-12-11 LAB — POCT INR: INR: 2.7 (ref 2.0–3.0)

## 2022-12-11 NOTE — Patient Instructions (Signed)
Description   Continue taking 1 tablet daily except 1/2 tablet each Wednesday.  Repeat INR in 7 weeks.  [facility]in Clinic #[phone]      

## 2023-01-15 DIAGNOSIS — I10 Essential (primary) hypertension: Secondary | ICD-10-CM | POA: Diagnosis not present

## 2023-01-21 DIAGNOSIS — Z23 Encounter for immunization: Secondary | ICD-10-CM | POA: Diagnosis not present

## 2023-01-21 DIAGNOSIS — I2510 Atherosclerotic heart disease of native coronary artery without angina pectoris: Secondary | ICD-10-CM | POA: Diagnosis not present

## 2023-01-21 DIAGNOSIS — I10 Essential (primary) hypertension: Secondary | ICD-10-CM | POA: Diagnosis not present

## 2023-01-21 DIAGNOSIS — D6869 Other thrombophilia: Secondary | ICD-10-CM | POA: Diagnosis not present

## 2023-01-21 DIAGNOSIS — Z0000 Encounter for general adult medical examination without abnormal findings: Secondary | ICD-10-CM | POA: Diagnosis not present

## 2023-01-21 DIAGNOSIS — M810 Age-related osteoporosis without current pathological fracture: Secondary | ICD-10-CM | POA: Diagnosis not present

## 2023-01-21 DIAGNOSIS — K219 Gastro-esophageal reflux disease without esophagitis: Secondary | ICD-10-CM | POA: Diagnosis not present

## 2023-01-21 DIAGNOSIS — Z952 Presence of prosthetic heart valve: Secondary | ICD-10-CM | POA: Diagnosis not present

## 2023-01-29 ENCOUNTER — Ambulatory Visit: Payer: Medicare Other | Attending: Cardiovascular Disease

## 2023-01-29 DIAGNOSIS — Z7901 Long term (current) use of anticoagulants: Secondary | ICD-10-CM

## 2023-01-29 DIAGNOSIS — Z952 Presence of prosthetic heart valve: Secondary | ICD-10-CM | POA: Diagnosis not present

## 2023-01-29 LAB — POCT INR: INR: 3.4 — AB (ref 2.0–3.0)

## 2023-01-29 NOTE — Patient Instructions (Signed)
Description   HOLD today's dose and then continue taking 1 tablet daily except 1/2 tablet each Wednesday.  Repeat INR in 5 weeks.  Coumadin Clinic [phone]

## 2023-02-04 ENCOUNTER — Other Ambulatory Visit: Payer: Self-pay | Admitting: Cardiovascular Disease

## 2023-02-04 DIAGNOSIS — Z7901 Long term (current) use of anticoagulants: Secondary | ICD-10-CM

## 2023-02-04 DIAGNOSIS — Z952 Presence of prosthetic heart valve: Secondary | ICD-10-CM

## 2023-02-07 DIAGNOSIS — J0100 Acute maxillary sinusitis, unspecified: Secondary | ICD-10-CM | POA: Diagnosis not present

## 2023-02-07 DIAGNOSIS — H6692 Otitis media, unspecified, left ear: Secondary | ICD-10-CM | POA: Diagnosis not present

## 2023-02-11 ENCOUNTER — Ambulatory Visit: Payer: Medicare Other | Admitting: Cardiovascular Disease

## 2023-03-05 ENCOUNTER — Ambulatory Visit: Payer: Medicare Other

## 2023-03-19 ENCOUNTER — Ambulatory Visit: Payer: Medicare Other | Attending: Cardiovascular Disease

## 2023-03-19 DIAGNOSIS — Z952 Presence of prosthetic heart valve: Secondary | ICD-10-CM | POA: Diagnosis not present

## 2023-03-19 DIAGNOSIS — Z5181 Encounter for therapeutic drug level monitoring: Secondary | ICD-10-CM | POA: Insufficient documentation

## 2023-03-19 LAB — POCT INR: INR: 2.2 (ref 2.0–3.0)

## 2023-03-19 NOTE — Patient Instructions (Signed)
Description   Continue taking 1 tablet daily except 1/2 tablet each Wednesday.  Repeat INR in 6 weeks.  Cou[facility]in Clinic #[phone]      

## 2023-04-09 ENCOUNTER — Ambulatory Visit: Payer: Medicare Other | Attending: Cardiovascular Disease | Admitting: Cardiovascular Disease

## 2023-04-09 ENCOUNTER — Encounter: Payer: Self-pay | Admitting: Cardiovascular Disease

## 2023-04-09 VITALS — BP 140/64 | HR 86 | Ht 61.0 in | Wt 123.0 lb

## 2023-04-09 DIAGNOSIS — Z952 Presence of prosthetic heart valve: Secondary | ICD-10-CM | POA: Insufficient documentation

## 2023-04-09 DIAGNOSIS — I10 Essential (primary) hypertension: Secondary | ICD-10-CM | POA: Insufficient documentation

## 2023-04-09 DIAGNOSIS — E782 Mixed hyperlipidemia: Secondary | ICD-10-CM | POA: Diagnosis not present

## 2023-04-09 NOTE — Progress Notes (Signed)
 04/09/2023 Julie Mcmahon   1927-08-02  [id number]  Primary Physician Clarice Nottingham, MD Primary Cardiologist: Dorn JINNY Lesches MD GENI CODY MADEIRA, FSCAI  HPI:  Julie Mcmahon is a 89 y.o.  thin-appearing, widowed Caucasian female, mother of 1, grandmother to 3 grandchildren who is retired from working a public librarian in [place]. I last saw her 01/10/2022.SABRA  She lives in the same house that she has been in for the last 25 years and is independent.  Her granddaughter lives next door.  She has known valvular heart disease status post aortic valve replacement with a St. Jude mechanical valve by Dr. Leetta Laine October 1998 for what sounds like bicuspid aortic stenosis. She is on chronic Coumadin  anticoagulation. Her other problems include hypertension and hyperlipidemia. She is aware of SBE prophylaxis. She denies chest pain or shortness of breath. Her last echo performed   11/26/2017, showed a well-functioning aortic mechanical prosthesis.. Dr. Clarice follows her lipid profile closely   Since I saw her a year ago she is remained stable.  She denies chest pain or shortness of breath.  2D echo performed 04/13/2022 revealed normal LV systolic function, grade 1 diastolic dysfunction and normally functioning aortic mechanical prosthesis.  She remains independent, continues to drive and goes to church.   Current Meds  Medication Sig   amLODipine (NORVASC) 2.5 MG tablet Take 2.5 mg by mouth daily.   Ascorbic Acid (VITAMIN C) 100 MG tablet Take 100 mg by mouth daily.   atorvastatin (LIPITOR) 10 MG tablet Take 10 mg by mouth every other day.   B Complex Vitamins (B COMPLEX 100 PO) Take 100 mg by mouth daily.   Calcium Carbonate-Vitamin D 600-200 MG-UNIT TABS Take 1 tablet by mouth daily.   hydrochlorothiazide (HYDRODIURIL) 25 MG tablet Take 25 mg by mouth daily.   metoprolol (LOPRESSOR) 50 MG tablet Take 25 mg by mouth daily.   vitamin E 400 UNIT capsule Take 400 Units by mouth daily.   warfarin  (COUMADIN ) 2.5 MG tablet Take 1/2 to 1 tablet by mouth once daily as directed by coumadin  clinic.     No Known Allergies  Social History   Socioeconomic History   Marital status: Married    Spouse name: Not on file   Number of children: Not on file   Years of education: Not on file   Highest education level: Not on file  Occupational History   Not on file  Tobacco Use   Smoking status: Never   Smokeless tobacco: Never  Substance and Sexual Activity   Alcohol use: Not on file   Drug use: Not on file   Sexual activity: Not on file  Other Topics Concern   Not on file  Social History Narrative   Not on file   Social Drivers of Health   Financial Resource Strain: Not on file  Food Insecurity: Not on file  Transportation Needs: Not on file  Physical Activity: Not on file  Stress: Not on file  Social Connections: Not on file  Intimate Partner Violence: Not on file     Review of Systems: General: negative for chills, fever, night sweats or weight changes.  Cardiovascular: negative for chest pain, dyspnea on exertion, edema, orthopnea, palpitations, paroxysmal nocturnal dyspnea or shortness of breath Dermatological: negative for rash Respiratory: negative for cough or wheezing Urologic: negative for hematuria Abdominal: negative for nausea, vomiting, diarrhea, bright red blood per rectum, melena, or hematemesis Neurologic: negative for visual changes, syncope, or dizziness  All other systems reviewed and are otherwise negative except as noted above.    Blood pressure (!) 140/64, pulse 86, height 5' 1 (1.549 m), weight 123 lb (55.8 kg), SpO2 96%.  General appearance: alert and no distress Neck: no adenopathy, no carotid bruit, no JVD, supple, symmetrical, trachea midline, and thyroid not enlarged, symmetric, no tenderness/mass/nodules Lungs: clear to auscultation bilaterally Heart: Crisp mechanical prosthetic valve sounds. Extremities: extremities normal, atraumatic, no  cyanosis or edema Pulses: 2+ and symmetric Skin: Skin color, texture, turgor normal. No rashes or lesions Neurologic: Grossly normal  EKG EKG Interpretation Date/Time:  Tuesday April 09 2023 10:33:10 EST Ventricular Rate:  86 PR Interval:  134 QRS Duration:  84 QT Interval:  362 QTC Calculation: 433 R Axis:   22  Text Interpretation: Sinus rhythm with Premature atrial complexes ST & T wave abnormality, consider anterolateral ischemia No previous ECGs available Confirmed by Court Carrier [phone]) on 04/09/2023 11:02:28 AM    ASSESSMENT AND PLAN:   H/O aortic valve replacement History of Saint Jude mechanical aortic valve replacement by Dr. Dusty October 1998 for what sounds like bicuspid aortic stenosis.  She is on Coumadin  anticoagulation.  Her last echo performed 04/13/2022 revealed normal LV systolic function, grade 1 diastolic dysfunction with a well-functioning aortic mechanical prosthesis.  Given her age and lack of symptoms I do not feel compelled to continue to follow this noninvasively.  Essential hypertension History of essential hypertension with blood pressure measured today at 140/64.  She is on low-dose amlodipine, metoprolol and hydrochlorothiazide.  Hyperlipidemia History of hyperlipidemia on statin therapy with lipid profile performed 10/15 so revealing a total cholesterol of 156, LDL 79 and HDL of 71.     Carrier DOROTHA Court MD FACP,FACC,FAHA, [facility] 04/09/2023 11:07 AM

## 2023-04-09 NOTE — Patient Instructions (Signed)

## 2023-04-09 NOTE — Assessment & Plan Note (Signed)
 History of Saint Jude mechanical aortic valve replacement by Dr. Dusty October 1998 for what sounds like bicuspid aortic stenosis.  She is on Coumadin  anticoagulation.  Her last echo performed 04/13/2022 revealed normal LV systolic function, grade 1 diastolic dysfunction with a well-functioning aortic mechanical prosthesis.  Given her age and lack of symptoms I do not feel compelled to continue to follow this noninvasively.

## 2023-04-09 NOTE — Assessment & Plan Note (Signed)
 History of essential hypertension with blood pressure measured today at 140/64.  She is on low-dose amlodipine, metoprolol and hydrochlorothiazide.

## 2023-04-09 NOTE — Assessment & Plan Note (Signed)
 History of hyperlipidemia on statin therapy with lipid profile performed 10/15 so revealing a total cholesterol of 156, LDL 79 and HDL of 71.

## 2023-04-30 ENCOUNTER — Ambulatory Visit: Payer: Medicare Other | Attending: Cardiovascular Disease

## 2023-04-30 DIAGNOSIS — Z952 Presence of prosthetic heart valve: Secondary | ICD-10-CM | POA: Insufficient documentation

## 2023-04-30 LAB — POCT INR: INR: 2.4 (ref 2.0–3.0)

## 2023-04-30 NOTE — Patient Instructions (Signed)
Description   Continue taking 1 tablet daily except 1/2 tablet each Wednesday.  Repeat INR in 6 weeks.  Coumadin Clinic [phone]

## 2023-05-07 ENCOUNTER — Ambulatory Visit: Payer: Medicare Other | Admitting: Cardiovascular Disease

## 2023-06-11 ENCOUNTER — Ambulatory Visit: Payer: Medicare Other | Attending: Cardiovascular Disease

## 2023-06-11 DIAGNOSIS — Z952 Presence of prosthetic heart valve: Secondary | ICD-10-CM | POA: Diagnosis not present

## 2023-06-11 LAB — POCT INR: INR: 2.3 (ref 2.0–3.0)

## 2023-06-11 NOTE — Patient Instructions (Signed)
 Description   Continue taking 1 tablet daily except 1/2 tablet each Wednesday.  Repeat INR in 6 weeks.  Coumadin Clinic [phone]

## 2023-07-16 DIAGNOSIS — R051 Acute cough: Secondary | ICD-10-CM | POA: Diagnosis not present

## 2023-07-16 DIAGNOSIS — R0981 Nasal congestion: Secondary | ICD-10-CM | POA: Diagnosis not present

## 2023-07-23 ENCOUNTER — Ambulatory Visit: Attending: Cardiovascular Disease

## 2023-07-23 DIAGNOSIS — Z952 Presence of prosthetic heart valve: Secondary | ICD-10-CM | POA: Insufficient documentation

## 2023-07-23 LAB — POCT INR: INR: 2.2 (ref 2.0–3.0)

## 2023-07-23 NOTE — Patient Instructions (Signed)
 Description   Continue taking 1 tablet daily except 1/2 tablet each Wednesday.  Repeat INR in 6 weeks.  Coumadin Clinic [phone]

## 2023-08-06 ENCOUNTER — Other Ambulatory Visit: Payer: Self-pay | Admitting: Cardiovascular Disease

## 2023-08-06 DIAGNOSIS — Z952 Presence of prosthetic heart valve: Secondary | ICD-10-CM

## 2023-08-06 DIAGNOSIS — Z7901 Long term (current) use of anticoagulants: Secondary | ICD-10-CM

## 2023-09-03 ENCOUNTER — Ambulatory Visit: Attending: Cardiovascular Disease

## 2023-09-03 DIAGNOSIS — Z952 Presence of prosthetic heart valve: Secondary | ICD-10-CM | POA: Diagnosis not present

## 2023-09-03 LAB — POCT INR: INR: 2.9 (ref 2.0–3.0)

## 2023-09-03 NOTE — Patient Instructions (Signed)
 Description   Continue taking 1 tablet daily except 1/2 tablet each Wednesday.  Repeat INR in 6 weeks.  Coumadin Clinic [phone]

## 2023-10-03 DIAGNOSIS — R300 Dysuria: Secondary | ICD-10-CM | POA: Diagnosis not present

## 2023-10-15 ENCOUNTER — Ambulatory Visit: Attending: Cardiovascular Disease

## 2023-10-15 DIAGNOSIS — Z952 Presence of prosthetic heart valve: Secondary | ICD-10-CM | POA: Diagnosis not present

## 2023-10-15 LAB — POCT INR: INR: 2.4 (ref 2.0–3.0)

## 2023-10-15 NOTE — Patient Instructions (Signed)
 Description   Continue taking 1 tablet daily except 1/2 tablet each Wednesday.  Repeat INR in 6 weeks.  Coumadin Clinic [phone]

## 2023-10-15 NOTE — Progress Notes (Signed)
Please see anticoagulation encounter.

## 2023-11-26 ENCOUNTER — Ambulatory Visit: Attending: Cardiovascular Disease

## 2023-11-26 DIAGNOSIS — Z952 Presence of prosthetic heart valve: Secondary | ICD-10-CM | POA: Diagnosis not present

## 2023-11-26 DIAGNOSIS — Z7901 Long term (current) use of anticoagulants: Secondary | ICD-10-CM | POA: Insufficient documentation

## 2023-11-26 LAB — POCT INR: INR: 3 (ref 2.0–3.0)

## 2023-11-26 NOTE — Patient Instructions (Signed)
 Continue taking 1 tablet daily except 1/2 tablet each Wednesday.  Repeat INR in 6 weeks.  Coumadin  Clinic [phone]

## 2023-12-27 DIAGNOSIS — R0981 Nasal congestion: Secondary | ICD-10-CM | POA: Diagnosis not present

## 2023-12-27 DIAGNOSIS — J0100 Acute maxillary sinusitis, unspecified: Secondary | ICD-10-CM | POA: Diagnosis not present

## 2024-01-07 ENCOUNTER — Ambulatory Visit: Attending: Cardiology

## 2024-01-07 DIAGNOSIS — Z952 Presence of prosthetic heart valve: Secondary | ICD-10-CM | POA: Insufficient documentation

## 2024-01-07 DIAGNOSIS — Z7901 Long term (current) use of anticoagulants: Secondary | ICD-10-CM | POA: Insufficient documentation

## 2024-01-07 LAB — POCT INR: INR: 2.6 (ref 2.0–3.0)

## 2024-01-07 NOTE — Patient Instructions (Signed)
 Continue taking 1 tablet daily except 1/2 tablet each Wednesday.  Repeat INR in 6 weeks.  Coumadin  Clinic [phone]

## 2024-02-14 ENCOUNTER — Other Ambulatory Visit: Payer: Self-pay | Admitting: Cardiovascular Disease

## 2024-02-14 DIAGNOSIS — Z7901 Long term (current) use of anticoagulants: Secondary | ICD-10-CM

## 2024-02-14 DIAGNOSIS — Z952 Presence of prosthetic heart valve: Secondary | ICD-10-CM

## 2024-02-18 ENCOUNTER — Ambulatory Visit: Attending: Cardiovascular Disease

## 2024-02-18 DIAGNOSIS — Z952 Presence of prosthetic heart valve: Secondary | ICD-10-CM | POA: Insufficient documentation

## 2024-02-18 DIAGNOSIS — Z7901 Long term (current) use of anticoagulants: Secondary | ICD-10-CM | POA: Diagnosis not present

## 2024-02-18 LAB — POCT INR: INR: 4.1 — AB (ref 2.0–3.0)

## 2024-02-18 NOTE — Patient Instructions (Signed)
 Hold tonight only then Continue taking 1 tablet daily except 1/2 tablet each Wednesday.  Repeat INR in 4 weeks.  Coumadin  Clinic [phone]

## 2024-03-02 DIAGNOSIS — M810 Age-related osteoporosis without current pathological fracture: Secondary | ICD-10-CM | POA: Diagnosis not present

## 2024-03-02 DIAGNOSIS — E7800 Pure hypercholesterolemia, unspecified: Secondary | ICD-10-CM | POA: Diagnosis not present

## 2024-03-02 DIAGNOSIS — I10 Essential (primary) hypertension: Secondary | ICD-10-CM | POA: Diagnosis not present

## 2024-03-05 DIAGNOSIS — G5601 Carpal tunnel syndrome, right upper limb: Secondary | ICD-10-CM | POA: Diagnosis not present

## 2024-03-05 DIAGNOSIS — Z0000 Encounter for general adult medical examination without abnormal findings: Secondary | ICD-10-CM | POA: Diagnosis not present

## 2024-03-05 DIAGNOSIS — K219 Gastro-esophageal reflux disease without esophagitis: Secondary | ICD-10-CM | POA: Diagnosis not present

## 2024-03-05 DIAGNOSIS — L578 Other skin changes due to chronic exposure to nonionizing radiation: Secondary | ICD-10-CM | POA: Diagnosis not present

## 2024-03-05 DIAGNOSIS — I2510 Atherosclerotic heart disease of native coronary artery without angina pectoris: Secondary | ICD-10-CM | POA: Diagnosis not present

## 2024-03-05 DIAGNOSIS — M810 Age-related osteoporosis without current pathological fracture: Secondary | ICD-10-CM | POA: Diagnosis not present

## 2024-03-05 DIAGNOSIS — Z7901 Long term (current) use of anticoagulants: Secondary | ICD-10-CM | POA: Diagnosis not present

## 2024-03-05 DIAGNOSIS — D6869 Other thrombophilia: Secondary | ICD-10-CM | POA: Diagnosis not present

## 2024-03-05 DIAGNOSIS — I10 Essential (primary) hypertension: Secondary | ICD-10-CM | POA: Diagnosis not present

## 2024-03-05 DIAGNOSIS — Z23 Encounter for immunization: Secondary | ICD-10-CM | POA: Diagnosis not present

## 2024-03-05 DIAGNOSIS — I8393 Asymptomatic varicose veins of bilateral lower extremities: Secondary | ICD-10-CM | POA: Diagnosis not present

## 2024-03-05 DIAGNOSIS — E7800 Pure hypercholesterolemia, unspecified: Secondary | ICD-10-CM | POA: Diagnosis not present

## 2024-03-17 ENCOUNTER — Ambulatory Visit

## 2024-03-24 ENCOUNTER — Ambulatory Visit: Attending: Cardiovascular Disease

## 2024-03-24 DIAGNOSIS — Z7901 Long term (current) use of anticoagulants: Secondary | ICD-10-CM | POA: Diagnosis present

## 2024-03-24 DIAGNOSIS — Z952 Presence of prosthetic heart valve: Secondary | ICD-10-CM | POA: Diagnosis present

## 2024-03-24 LAB — POCT INR: INR: 2.9 (ref 2.0–3.0)

## 2024-03-24 NOTE — Patient Instructions (Signed)
 Continue taking 1 tablet daily except 1/2 tablet each Wednesday.  Repeat INR in 6 weeks.  Coumadin  Clinic [phone]

## 2024-05-05 ENCOUNTER — Ambulatory Visit

## 2024-05-12 ENCOUNTER — Ambulatory Visit
